# Patient Record
Sex: Female | Born: 1970 | Race: White | Hispanic: No | Marital: Married | Smoking: Never smoker
Health system: Southern US, Community
[De-identification: ages and names within clinical notes are randomized; demographics above are authoritative.]

## PROBLEM LIST (undated history)

## (undated) ENCOUNTER — Ambulatory Visit: Disposition: A | Discharge status: Other Acute Inpatient Hospital | Payer: 59

## (undated) HISTORY — PX: HAND TENDON SURGERY: SHX663

---

## 2004-06-04 ENCOUNTER — Inpatient Hospital Stay (HOSPITAL_COMMUNITY): Admission: AD | Admit: 2004-06-04 | Discharge: 2004-06-07 | Payer: Self-pay | Admitting: Gynecology

## 2013-09-01 DIAGNOSIS — M7502 Adhesive capsulitis of left shoulder: Secondary | ICD-10-CM | POA: Insufficient documentation

## 2014-06-10 ENCOUNTER — Ambulatory Visit: Payer: Self-pay | Admitting: Physician Assistant

## 2014-06-16 ENCOUNTER — Ambulatory Visit: Payer: Self-pay | Admitting: Specialist

## 2015-03-17 NOTE — Op Note (Signed)
PATIENT NAME:  Sherri Ware, [name] MR#:  [id number] DATE OF BIRTH:  1971-08-02  DATE OF PROCEDURE:  06/16/2014  PREOPERATIVE DIAGNOSIS: Complete laceration of extensor tendon, right long finger.   POSTOPERATIVE DIAGNOSIS: Complete laceration of extensor tendon, right long finger.   PROCEDURE: Repair of extensor tendon, right long finger.   SURGEON: Myra Rudehristopher  Valentin Benney, M.D.   ANESTHESIA: General.   COMPLICATIONS: None.   TOURNIQUET TIME: 28 minutes.   DESCRIPTION OF PROCEDURE: Two grams of Ancef is given intravenously prior to the procedure. General anesthesia is induced. The right upper extremity is thoroughly prepped with alcohol and ChloraPrep and draped in standard sterile fashion. Under loupe magnification, the incision is extended toward the radial hand, approximately 3/8 of an inch. The dissection is carefully carried down to the previous laceration and this extension. There seem to be complete laceration of the long finger extensor tendon. Scar tissue is removed with the rongeur. The ends are mobilized and put together.  The repair is performed with a Kessler 4 - 0 Mersilene suture and then the ends of the tendon are brought together to produce a smooth surface using a #6   Nylon. The wound is thoroughly irrigated multiple times. Skin edges are infiltrated with 0.5% plain Xylocaine. The skin is closed with a running subcuticular 4-0 nylon. A soft bulky dressing is applied. Dorsal splint is applied keeping the ring and long fingers extended with slight wrist dorsiflexion.  The tourniquet is released. The patient is returned to the recovery room in satisfactory condition having tolerated the procedure quite well.   ____________________________ Clare Gandyhristopher E. Arminta Gamm, MD ces:ts D: 06/16/2014 10:46:09 ET T: 06/16/2014 11:02:28 ET JOB#: [id number]  cc: Clare Gandyhristopher E. Aadi Bordner, MD, <Dictator> Clare GandyHRISTOPHER E Laquasha Groome MD ELECTRONICALLY SIGNED 06/22/2014 13:25

## 2016-09-22 ENCOUNTER — Encounter: Payer: Self-pay | Admitting: Emergency Medicine

## 2016-09-22 ENCOUNTER — Ambulatory Visit
Admission: EM | Admit: 2016-09-22 | Discharge: 2016-09-22 | Disposition: A | Discharge status: Home / Self Care | Payer: BLUE CROSS/BLUE SHIELD | Attending: Family Medicine | Admitting: Family Medicine

## 2016-09-22 DIAGNOSIS — J069 Acute upper respiratory infection, unspecified: Secondary | ICD-10-CM

## 2016-09-22 LAB — RAPID STREP SCREEN (MED CTR MEBANE ONLY): STREPTOCOCCUS, GROUP A SCREEN (DIRECT): NEGATIVE

## 2016-09-22 MED ORDER — BENZONATATE 100 MG PO CAPS: 100.0000 mg | ORAL_CAPSULE | Freq: Three times a day (TID) | ORAL | 0 refills | Status: DC | PRN

## 2016-09-22 MED ORDER — HYDROCOD POLST-CPM POLST ER 10-8 MG/5ML PO SUER: 5.0000 mL | Freq: Every evening | ORAL | 0 refills | Status: DC | PRN

## 2016-09-22 MED ORDER — ALBUTEROL SULFATE HFA 108 (90 BASE) MCG/ACT IN AERS: 2.0000 | INHALATION_SPRAY | RESPIRATORY_TRACT | 0 refills | Status: DC | PRN

## 2016-09-22 NOTE — ED Triage Notes (Signed)
Patient c/o cough, chest congestion, sinus pressure, HAs, and sore throat that started on Sunday.

## 2016-09-22 NOTE — ED Provider Notes (Addendum)
[facility] ____________________________________________  Time seen: Approximately 12:33 PM  I have reviewed the triage vital signs and the nursing notes.   HISTORY  Chief Complaint Facial Pain; Cough; and Sore Throat   HPI Sherri Ware is a 45 y.o. female presents for the complaints of 2.5 days of runny nose, nasal congestion and cough. Patient reports cough is worse at night. States cough is occasionally productive. Reports occasional production of blowing her nose of yellowish mucus. Reports unable to breathe well through her nose and having to mouth breathe, due to congestion. States occasional wheeze heard at night. Also reports accompanying sore throat. Reports believes she had a 101 fever this morning. Denies known sick contacts. Reports continues to eat and drink well. Patient states that she feels like she has the flu. Reports that she did have her flu immunization this year. Reports unresolved with over the counter cough and congestion medication.   Denies chest pain, shortness of breath, chest pain with deep breath, abdominal pain, dysuria, neck pain, back pain, extremity pain or extremity swelling. Denies recent sickness. Denies recent antibiotic use.  Patient's last menstrual period was 09/15/2016 (exact date).Denies chance of pregnancy.  History reviewed. No pertinent past medical history.  There are no active problems to display for this patient.   History reviewed. No pertinent surgical history.  Current Outpatient Rx  . Order #: [id number] Class: Historical Med  . Order #: [id number] Class: Historical Med  . Order #: [id number] Class: Normal  . Order #: [id number] Class: Normal  . Order #: [id number] Class: Print    No current facility-administered medications for this encounter.   Current Outpatient Prescriptions:  .  griseofulvin (GRIS-PEG) 250 MG tablet, Take 250 mg by mouth 3 (three) times daily., Disp: , Rfl:  .  omeprazole (PRILOSEC) 40 MG  capsule, Take 40 mg by mouth daily., Disp: , Rfl:  .  albuterol (PROVENTIL HFA;VENTOLIN HFA) 108 (90 Base) MCG/ACT inhaler, Inhale 2 puffs into the lungs every 4 (four) hours as needed., Disp: 1 Inhaler, Rfl: 0 .  benzonatate (TESSALON PERLES) 100 MG capsule, Take 1 capsule (100 mg total) by mouth 3 (three) times daily as needed., Disp: 15 capsule, Rfl: 0 .  chlorpheniramine-HYDROcodone (TUSSIONEX PENNKINETIC ER) 10-8 MG/5ML SUER, Take 5 mLs by mouth at bedtime as needed. do not drive or operate machinery while taking as can cause drowsiness., Disp: 75 mL, Rfl: 0  Allergies Review of patient's allergies indicates no known allergies.  Family History  Problem Relation Age of Onset  . Hypertension Mother   . Diabetes Mother   . Cancer Mother     Social History Social History  Substance Use Topics  . Smoking status: Never Smoker  . Smokeless tobacco: Never Used  . Alcohol use No    Review of Systems Constitutional: As above.  Eyes: No visual changes. ENT: As above.  Cardiovascular: Denies chest pain. Respiratory: Denies shortness of breath. Gastrointestinal: No abdominal pain.  No nausea, no vomiting.  No diarrhea.  No constipation. Genitourinary: Negative for dysuria. Musculoskeletal: Negative for back pain. Skin: Negative for rash. Neurological: Negative for headaches, focal weakness or numbness.  10-point ROS otherwise negative.  ____________________________________________   PHYSICAL EXAM:  VITAL SIGNS: ED Triage Vitals  Enc Vitals Group     BP 09/22/16 1153 114/65     Pulse Rate 09/22/16 1153 84     Resp 09/22/16 1153 16     Temp 09/22/16 1153 98.2 F (36.8 C)     Temp Source 09/22/16 1153 Oral  SpO2 09/22/16 1153 98 %     Weight 09/22/16 1152 190 lb (86.2 kg)     Height 09/22/16 1152 5\' 4"  (1.626 m)     Head Circumference --      Peak Flow --      Pain Score 09/22/16 1157 3     Pain Loc --      Pain Edu? --      Excl. in GC? --     Constitutional:  Alert and oriented. Well appearing and in no acute distress. Eyes: Conjunctivae are normal. PERRL. EOMI. Head: Atraumatic. Mild tenderness to palpation bilateral frontal and maxillary sinuses. No swelling. No erythema.  Ears: no erythema, normal TMs bilaterally.   Nose:Nasal congestion with clear rhinorrhea  Mouth/Throat: Mucous membranes are moist. Mild pharyngeal erythema. No tonsillar swelling or exudate.  Neck: No stridor.  No cervical spine tenderness to palpation. Hematological/Lymphatic/Immunilogical: No cervical lymphadenopathy. Cardiovascular: Normal rate, regular rhythm. Grossly normal heart sounds.  Good peripheral circulation. Respiratory: Normal respiratory effort.  No retractions. Lungs CTAB.No wheezes, rales or rhonchi. Good air movement. Occasional dry cough noted in room, minimal bronchospasm wheeze noted with cough. Speaks in complete sentences.  Gastrointestinal: Soft and nontender.  No CVA tenderness. Musculoskeletal: No lower or upper extremity tenderness nor edema. No cervical, thoracic or lumbar tenderness to palpation.Bilateral pedal pulses equal and easily palpated.  Neurologic:  Normal speech and language. No gross focal neurologic deficits are appreciated. No gait instability. Skin:  Skin is warm, dry and intact. No rash noted. Psychiatric: Mood and affect are normal. Speech and behavior are normal.    ___________________________________________   LABS (all labs ordered are listed, but only abnormal results are displayed)  Labs Reviewed  RAPID STREP SCREEN (NOT AT [facility])     PROCEDURES Procedures   _________________________________________   INITIAL IMPRESSION / ASSESSMENT AND PLAN / ED COURSE  Pertinent labs & imaging results that were available during my care of the patient were reviewed by me and considered in my medical decision making (see chart for details).  Very well-appearing patient. 2.5 days of symptoms. Lungs clear throughout, except minimal  bronchospasm and wheeze noted with active cough. Occasional active cough noted in room. Suspect viral upper respiratory infection. Discussed swabbing for influenza versus treat for influenza with Tamiflu, patient declined Tamiflu as well as swabbing for flu. swab for strep throat.Strep negative, will culture. Encouraged supportive care. When necessary Tussionex, Tessalon Perles and albuterol inhaler. Encouraged rest, fluids and PCP follow-up as needed.Discussed indication, risks and benefits of medications with patient.  Discussed follow up with Primary care physician this week. Discussed follow up and return parameters including no resolution or any worsening concerns. Patient verbalized understanding and agreed to plan.   ____________________________________________   FINAL CLINICAL IMPRESSION(S) / ED DIAGNOSES  Final diagnoses:  Upper respiratory tract infection, unspecified type     New Prescriptions   ALBUTEROL (PROVENTIL HFA;VENTOLIN HFA) 108 (90 BASE) MCG/ACT INHALER    Inhale 2 puffs into the lungs every 4 (four) hours as needed.   BENZONATATE (TESSALON PERLES) 100 MG CAPSULE    Take 1 capsule (100 mg total) by mouth 3 (three) times daily as needed.   CHLORPHENIRAMINE-HYDROCODONE (TUSSIONEX PENNKINETIC ER) 10-8 MG/5ML SUER    Take 5 mLs by mouth at bedtime as needed. do not drive or operate machinery while taking as can cause drowsiness.    Note: This dictation was prepared with Dragon dictation along with smaller phrase technology. Any transcriptional errors that result from this process  are unintentional.    Clinical Course      Renford Dills, NP 09/22/16 [phone]

## 2016-09-22 NOTE — Discharge Instructions (Signed)
Take medication as prescribed. Rest. Drink plenty of fluids.  ° °Follow up with your primary care physician this week as needed. Return to Urgent care for new or worsening concerns.  ° °

## 2016-09-25 LAB — CULTURE, GROUP A STREP (THRC)

## 2016-09-26 ENCOUNTER — Telehealth: Payer: Self-pay | Admitting: Emergency Medicine

## 2016-09-26 NOTE — Telephone Encounter (Signed)
Patient notified of her throat culture result.  Patient states that she is feeling much better.  Patient instructed to follow-up here or with PCP if her symptoms worsen.  Patient verbalized understanding.

## 2017-03-31 ENCOUNTER — Other Ambulatory Visit: Payer: Self-pay | Admitting: Obstetrics & Gynecology

## 2017-03-31 DIAGNOSIS — R928 Other abnormal and inconclusive findings on diagnostic imaging of breast: Secondary | ICD-10-CM

## 2017-03-31 DIAGNOSIS — Z1239 Encounter for other screening for malignant neoplasm of breast: Secondary | ICD-10-CM

## 2017-04-02 ENCOUNTER — Telehealth: Payer: Self-pay | Admitting: Obstetrics & Gynecology

## 2017-04-02 NOTE — Telephone Encounter (Signed)
Lmtrc

## 2017-04-13 NOTE — Telephone Encounter (Signed)
Lmtrc

## 2017-04-15 NOTE — Telephone Encounter (Signed)
Lmtrc

## 2017-04-15 NOTE — Telephone Encounter (Signed)
I called [facility] and spoke to Esaw DaceGail Tate (different Dondra SpryGail from the earlier call) and asked if there was a program available that may cover the cost for the patient. She will check and call back. My phone# and ext were given.

## 2017-04-15 NOTE — Telephone Encounter (Signed)
I spoke to [facility] @ [facility], who said one of their schedulers spoke to the patient on 04/02/17 @ 3:45pm, that the patient stated she shouldn't have orders from her doctor, that she hadn't been seen in a long time, that they must have her confused with someone else, but after confirming patient info, the patient said she was sorry but she didn't want to schedule an appointment.  As soon as I hung up with Dondra SpryGail, the patient returned my earlier call. She said she accidentally messed up her insurance and went to a high deductible plan this year, that everything is out of pocket and she cannot afford to be seen until January when her insurance is changed back. I suggested [facility] in December to schedule the appointment and if they need new orders faxed to let us know. She said she would. I also mentioned that Dr Tiburcio PeaHarris said she is due for her annual, and that annuals are considered preventative with insurance, but the patient said she does not want to schedule the annual, that [place] in [place] and it's hard for [place] to be seen in [place]. The patient said she would call back when she can schedule an appointment.

## 2017-04-21 NOTE — Telephone Encounter (Signed)
I contacted Esaw DaceGail Tate about whether [facility] had a program that would assist the patient with costs, and they do not. Per Dondra SpryGail, they do have a 30% discount but the patient would have to let them know at the time of the appointment not to file her insurance, and the patient would have to pay for the tests at the time of the appt. Per Dondra SpryGail $405 dx bilat mammo and [phone] uni ultrasound so patient would owe $673 - 30% at time of appt. They also take Visa and MasterCard.  I contacted Joellyn QuailsChristy Burton @ [facility], but the patient would have to be seen for their screening mammogram to have coverage for the diagnostic. Lmtrc for the patient.

## 2017-04-24 NOTE — Telephone Encounter (Signed)
I contacted the patient and gave her the self pay information and details according to my conversation with Esaw DaceGail Tate at [facility]. The patient said she does have an HSA and could use the MasterCard, and said she may call [facility] to go ahead and have the imaging done.

## 2017-05-01 ENCOUNTER — Encounter: Payer: Self-pay | Admitting: Obstetrics & Gynecology

## 2017-05-05 ENCOUNTER — Encounter: Payer: Self-pay | Admitting: Obstetrics & Gynecology

## 2017-07-06 ENCOUNTER — Encounter: Payer: Self-pay | Admitting: Obstetrics & Gynecology

## 2019-11-04 ENCOUNTER — Other Ambulatory Visit: Payer: Self-pay

## 2019-11-04 DIAGNOSIS — Z20822 Contact with and (suspected) exposure to covid-19: Secondary | ICD-10-CM

## 2019-11-06 LAB — NOVEL CORONAVIRUS, NAA: SARS-CoV-2, NAA: NOT DETECTED

## 2020-02-23 ENCOUNTER — Ambulatory Visit: Payer: BLUE CROSS/BLUE SHIELD | Attending: Internal Medicine

## 2020-02-23 DIAGNOSIS — Z23 Encounter for immunization: Secondary | ICD-10-CM

## 2020-02-23 NOTE — Progress Notes (Signed)
   Covid-19 Vaccination Clinic  Name:  STACYE NOORI    MRN: [id number] DOB: 06/04/1971  02/23/2020  Ms. Marando was observed post Covid-19 immunization for 15 minutes without incident. She was provided with Vaccine Information Sheet and instruction to access the V-Safe system.   Ms. Tayag was instructed to call 911 with any severe reactions post vaccine: Marland Kitchen Difficulty breathing  . Swelling of face and throat  . A fast heartbeat  . A bad rash all over body  . Dizziness and weakness   Immunizations Administered    Name Date Dose VIS Date Route   Pfizer COVID-19 Vaccine 02/23/2020 11:23 AM 0.3 mL 11/04/2019 Intramuscular   Manufacturer: [employer], [employer]   Lot: [id number]   NDC: [address]-3

## 2020-02-27 ENCOUNTER — Ambulatory Visit: Payer: BLUE CROSS/BLUE SHIELD

## 2020-03-21 ENCOUNTER — Ambulatory Visit: Payer: BLUE CROSS/BLUE SHIELD | Attending: Internal Medicine

## 2020-03-21 DIAGNOSIS — Z23 Encounter for immunization: Secondary | ICD-10-CM

## 2020-03-21 NOTE — Progress Notes (Signed)
   Covid-19 Vaccination Clinic  Name:  Sherri Ware    MRN: [id number] DOB: [birth year]  03/21/2020  Sherri Ware was observed post Covid-19 immunization for 15 minutes without incident. She was provided with Vaccine Information Sheet and instruction to access the V-Safe system.   Sherri Ware was instructed to call 911 with any severe reactions post vaccine: Marland Kitchen Difficulty breathing  . Swelling of face and throat  . A fast heartbeat  . A bad rash all over body  . Dizziness and weakness   Immunizations Administered    Name Date Dose VIS Date Route   Pfizer COVID-19 Vaccine 03/21/2020  3:38 PM 0.3 mL 01/18/2019 Intramuscular   Manufacturer: [employer], [employer]   Lot: [id number]   NDC: [address]-5

## 2020-06-22 ENCOUNTER — Ambulatory Visit: Payer: BLUE CROSS/BLUE SHIELD | Attending: Internal Medicine

## 2020-06-22 DIAGNOSIS — Z20822 Contact with and (suspected) exposure to covid-19: Secondary | ICD-10-CM | POA: Insufficient documentation

## 2020-06-23 LAB — SARS-COV-2, NAA 2 DAY TAT

## 2020-06-23 LAB — NOVEL CORONAVIRUS, NAA: SARS-CoV-2, NAA: NOT DETECTED

## 2021-02-05 ENCOUNTER — Other Ambulatory Visit: Payer: Self-pay

## 2021-02-05 ENCOUNTER — Ambulatory Visit
Admission: EM | Admit: 2021-02-05 | Discharge: 2021-02-05 | Disposition: A | Discharge status: Home / Self Care | Payer: 59 | Attending: Family Medicine | Admitting: Family Medicine

## 2021-02-05 ENCOUNTER — Encounter: Payer: Self-pay | Admitting: Emergency Medicine

## 2021-02-05 DIAGNOSIS — K529 Noninfective gastroenteritis and colitis, unspecified: Secondary | ICD-10-CM

## 2021-02-05 MED ORDER — PROMETHAZINE HCL 25 MG PO TABS: 25.0000 mg | ORAL_TABLET | Freq: Three times a day (TID) | ORAL | 0 refills | Status: DC | PRN

## 2021-02-05 MED ORDER — ONDANSETRON 4 MG PO TBDP: 4.0000 mg | ORAL_TABLET | Freq: Three times a day (TID) | ORAL | 0 refills | Status: DC | PRN

## 2021-02-05 MED ORDER — ONDANSETRON HCL 4 MG/2ML IJ SOLN
8.0000 mg | Freq: Once | INTRAMUSCULAR | Status: AC
  Administered 2021-02-05: 8 mg via INTRAMUSCULAR

## 2021-02-05 NOTE — Discharge Instructions (Addendum)
Frequent sips of liquids (preferably Gatorade, Powerade, or Pedialyte).  Use the medication as directed.  If you continue to have difficulty keep liquids down despite the medication, go to the Emergency Room.  This is going around the community. It will improve over the next few days. The key is to stay hydrated as best as possible to prevent dehydration.  Take care  Dr. Adriana Simas

## 2021-02-05 NOTE — ED Provider Notes (Signed)
[facility]    CSN: [id number] Arrival date & time: 02/05/21  1813      History   Chief Complaint Chief Complaint  Patient presents with  . Emesis    HPI  50 year old female presents with nausea, vomiting, diarrhea.  Started earlier this afternoon.  She states that she had a fluoride treatment at her dentist and then subsequently had an ice coffee at [facility].  She states that she subsequently developed nausea, vomiting, diarrhea.  Patient states that she is concerned that she may have fluoride poisoning due to the dental varnish that was placed.  Patient feels incredibly nauseated currently.  No pain.  No relieving factors.  No reported sick contacts.  No other reported symptoms.   Home Medications    Prior to Admission medications   Medication Sig Start Date End Date Taking? Authorizing Provider  ondansetron (ZOFRAN ODT) 4 MG disintegrating tablet Take 1 tablet (4 mg total) by mouth every 8 (eight) hours as needed for nausea or vomiting. 02/05/21  Yes Adriana Simas, Grettell Ransdell G, DO  promethazine (PHENERGAN) 25 MG tablet Take 1 tablet (25 mg total) by mouth every 8 (eight) hours as needed for refractory nausea / vomiting. 02/05/21  Yes Teshawn Moan G, DO  albuterol (PROVENTIL HFA;VENTOLIN HFA) 108 (90 Base) MCG/ACT inhaler Inhale 2 puffs into the lungs every 4 (four) hours as needed. 09/22/16 02/05/21  Renford Dills, NP  omeprazole (PRILOSEC) 40 MG capsule Take 40 mg by mouth daily.  02/05/21  [provider]    Family History Family History  Problem Relation Age of Onset  . Hypertension Mother   . Diabetes Mother   . Cancer Mother     Social History Social History   Tobacco Use  . Smoking status: Never Smoker  . Smokeless tobacco: Never Used  Vaping Use  . Vaping Use: Never used  Substance Use Topics  . Alcohol use: No  . Drug use: No     Allergies   Patient has no known allergies.   Review of Systems Review of Systems  Constitutional: Negative for  fever.  Gastrointestinal: Positive for diarrhea, nausea and vomiting.   Physical Exam Triage Vital Signs ED Triage Vitals  Enc Vitals Group     BP 02/05/21 1830 125/89     Pulse Rate 02/05/21 1830 95     Resp 02/05/21 1830 18     Temp 02/05/21 1830 98 F (36.7 C)     Temp Source 02/05/21 1830 Oral     SpO2 02/05/21 1830 97 %     Weight 02/05/21 1828 190 lb 0.6 oz (86.2 kg)     Height 02/05/21 1828 5\' 4"  (1.626 m)     Head Circumference --      Peak Flow --      Pain Score 02/05/21 1827 0     Pain Loc --      Pain Edu? --      Excl. in GC? --    Updated Vital Signs BP 125/89 (BP Location: Right Arm)   Pulse 95   Temp 98 F (36.7 C) (Oral)   Resp 18   Ht 5\' 4"  (1.626 m)   Wt 86.2 kg   LMP 09/15/2016 (Exact Date)   SpO2 97%   BMI 32.62 kg/m   Visual Acuity Right Eye Distance:   Left Eye Distance:   Bilateral Distance:    Right Eye Near:   Left Eye Near:    Bilateral Near:     Physical  Exam Vitals and nursing note reviewed.  Constitutional:      Comments: Appears fatigued.  HENT:     Head: Normocephalic and atraumatic.     Mouth/Throat:     Pharynx: Oropharynx is clear. No oropharyngeal exudate or posterior oropharyngeal erythema.  Eyes:     General:        Right eye: No discharge.        Left eye: No discharge.     Conjunctiva/sclera: Conjunctivae normal.  Cardiovascular:     Rate and Rhythm: Normal rate and regular rhythm.  Pulmonary:     Effort: Pulmonary effort is normal.     Breath sounds: Normal breath sounds. No wheezing, rhonchi or rales.  Neurological:     Mental Status: She is alert.    UC Treatments / Results  Labs (all labs ordered are listed, but only abnormal results are displayed) Labs Reviewed - No data to display  EKG   Radiology No results found.  Procedures Procedures (including critical care time)  Medications Ordered in UC Medications  ondansetron (ZOFRAN) injection 8 mg (8 mg Intramuscular Given 02/05/21 1846)     Initial Impression / Assessment and Plan / UC Course  I have reviewed the triage vital signs and the nursing notes.  Pertinent labs & imaging results that were available during my care of the patient were reviewed by me and considered in my medical decision making (see chart for details).    49 year old female presents with gastroenteritis.  This is likely viral in origin.  I do not feel that this is secondary to her fluoride dental varnish today.  We had a lengthy discussion about this.  Patient was given IM Zofran and was tolerating fluids prior to discharge.  Sending home on ODT Zofran and as needed Phenergan.  Advised to push fluids.  Supportive care.  Final Clinical Impressions(s) / UC Diagnoses   Final diagnoses:  Gastroenteritis     Discharge Instructions     Frequent sips of liquids (preferably Gatorade, Powerade, or Pedialyte).  Use the medication as directed.  If you continue to have difficulty keep liquids down despite the medication, go to the Emergency Room.  This is going around the community. It will improve over the next few days. The key is to stay hydrated as best as possible to prevent dehydration.  Take care  Dr. Adriana Simas    ED Prescriptions    Medication Sig Dispense Auth. Provider   ondansetron (ZOFRAN ODT) 4 MG disintegrating tablet Take 1 tablet (4 mg total) by mouth every 8 (eight) hours as needed for nausea or vomiting. 20 tablet Jakin Pavao G, DO   promethazine (PHENERGAN) 25 MG tablet Take 1 tablet (25 mg total) by mouth every 8 (eight) hours as needed for refractory nausea / vomiting. 30 tablet Tommie Sams, DO     PDMP not reviewed this encounter.   Tommie Sams, [place] 02/05/21 1948

## 2021-02-05 NOTE — ED Triage Notes (Signed)
Pt c/o vomiting and diarrhea.  Started this afternoon about 2:30. She states she had a fluoride treatment at the dentist today and she looked it up on google and thinks she has fluoride poisoning. She states she had a sharp abdominal pain but has only had it once while vomiting. She states the diarrhea started about an hour ago.

## 2021-04-13 ENCOUNTER — Inpatient Hospital Stay
Admission: EM | Admit: 2021-04-13 | Discharge: 2021-04-15 | DRG: 872 | Disposition: A | Discharge status: Home / Self Care | Payer: 59 | Attending: Internal Medicine | Admitting: Internal Medicine

## 2021-04-13 ENCOUNTER — Emergency Department: Payer: 59

## 2021-04-13 ENCOUNTER — Other Ambulatory Visit: Payer: Self-pay

## 2021-04-13 DIAGNOSIS — Z20822 Contact with and (suspected) exposure to covid-19: Secondary | ICD-10-CM | POA: Diagnosis present

## 2021-04-13 DIAGNOSIS — E662 Morbid (severe) obesity with alveolar hypoventilation: Secondary | ICD-10-CM | POA: Diagnosis not present

## 2021-04-13 DIAGNOSIS — R079 Chest pain, unspecified: Secondary | ICD-10-CM | POA: Diagnosis not present

## 2021-04-13 DIAGNOSIS — R42 Dizziness and giddiness: Secondary | ICD-10-CM | POA: Diagnosis not present

## 2021-04-13 DIAGNOSIS — L0390 Cellulitis, unspecified: Secondary | ICD-10-CM

## 2021-04-13 DIAGNOSIS — Z6833 Body mass index (BMI) 33.0-33.9, adult: Secondary | ICD-10-CM | POA: Diagnosis not present

## 2021-04-13 DIAGNOSIS — Z79899 Other long term (current) drug therapy: Secondary | ICD-10-CM | POA: Diagnosis not present

## 2021-04-13 DIAGNOSIS — R55 Syncope and collapse: Secondary | ICD-10-CM | POA: Diagnosis not present

## 2021-04-13 DIAGNOSIS — A419 Sepsis, unspecified organism: Principal | ICD-10-CM | POA: Diagnosis present

## 2021-04-13 DIAGNOSIS — E871 Hypo-osmolality and hyponatremia: Secondary | ICD-10-CM | POA: Diagnosis present

## 2021-04-13 DIAGNOSIS — E669 Obesity, unspecified: Secondary | ICD-10-CM | POA: Diagnosis present

## 2021-04-13 DIAGNOSIS — N61 Mastitis without abscess: Secondary | ICD-10-CM | POA: Diagnosis present

## 2021-04-13 DIAGNOSIS — D72828 Other elevated white blood cell count: Secondary | ICD-10-CM | POA: Diagnosis not present

## 2021-04-13 LAB — COMPREHENSIVE METABOLIC PANEL
ALT: 20 U/L (ref 0–44)
AST: 18 U/L (ref 15–41)
Albumin: 4 g/dL (ref 3.5–5.0)
Alkaline Phosphatase: 96 U/L (ref 38–126)
Anion gap: 11 (ref 5–15)
BUN: 12 mg/dL (ref 6–20)
CO2: 23 mmol/L (ref 22–32)
Calcium: 9 mg/dL (ref 8.9–10.3)
Chloride: 99 mmol/L (ref 98–111)
Creatinine, Ser: 0.67 mg/dL (ref 0.44–1.00)
GFR, Estimated: >60 mL/min (ref >60)
Glucose, Bld: 106 mg/dL — ABNORMAL HIGH (ref 70–99)
Potassium: 3.5 mmol/L (ref 3.5–5.1)
Sodium: 133 mmol/L — ABNORMAL LOW (ref 135–145)
Total Bilirubin: 0.7 mg/dL (ref 0.3–1.2)
Total Protein: 8.1 g/dL (ref 6.5–8.1)

## 2021-04-13 LAB — CBC
HCT: 42.6 % (ref 36.0–46.0)
Hemoglobin: 14.3 g/dL (ref 12.0–15.0)
MCH: 28.3 pg (ref 26.0–34.0)
MCHC: 33.6 g/dL (ref 30.0–36.0)
MCV: 84.2 fL (ref 80.0–100.0)
Platelets: 235 10*3/uL (ref 150–400)
RBC: 5.06 MIL/uL (ref 3.87–5.11)
RDW: 13.2 % (ref 11.5–15.5)
WBC: 20.1 10*3/uL — ABNORMAL HIGH (ref 4.0–10.5)
nRBC: 0 % (ref 0.0–0.2)

## 2021-04-13 LAB — TROPONIN I (HIGH SENSITIVITY)
Troponin I (High Sensitivity): <2 ng/L (ref <18)
Troponin I (High Sensitivity): 3 ng/L (ref <18)

## 2021-04-13 LAB — LACTIC ACID, PLASMA: Lactic Acid, Venous: 1.7 mmol/L (ref 0.5–1.9)

## 2021-04-13 LAB — RESP PANEL BY RT-PCR (FLU A&B, COVID) ARPGX2
Influenza A by PCR: NEGATIVE
Influenza B by PCR: NEGATIVE
SARS Coronavirus 2 by RT PCR: NEGATIVE

## 2021-04-13 LAB — POC URINE PREG, ED: Preg Test, Ur: NEGATIVE

## 2021-04-13 MED ORDER — KETOROLAC TROMETHAMINE 15 MG/ML IJ SOLN
15.0000 mg | Freq: Four times a day (QID) | INTRAMUSCULAR | Status: DC | PRN
  Administered 2021-04-13 – 2021-04-14 (×3): 15 mg via INTRAVENOUS
  Filled 2021-04-13 (×4): qty 1

## 2021-04-13 MED ORDER — SODIUM CHLORIDE 0.9 % IV SOLN
2.0000 g | INTRAVENOUS | Status: DC
  Administered 2021-04-13 – 2021-04-14 (×2): 2 g via INTRAVENOUS
  Filled 2021-04-13 (×3): qty 20

## 2021-04-13 MED ORDER — MAGNESIUM HYDROXIDE 400 MG/5ML PO SUSP: 30.0000 mL | Freq: Every day | ORAL | Status: DC | PRN

## 2021-04-13 MED ORDER — SODIUM CHLORIDE 0.9 % IV BOLUS
1000.0000 mL | Freq: Once | INTRAVENOUS | Status: AC
  Administered 2021-04-13: 1000 mL via INTRAVENOUS

## 2021-04-13 MED ORDER — TRAZODONE HCL 50 MG PO TABS
25.0000 mg | ORAL_TABLET | Freq: Every evening | ORAL | Status: DC | PRN
  Administered 2021-04-13 – 2021-04-14 (×2): 25 mg via ORAL
  Filled 2021-04-13 (×2): qty 1

## 2021-04-13 MED ORDER — ACETAMINOPHEN 500 MG PO TABS
1000.0000 mg | ORAL_TABLET | Freq: Once | ORAL | Status: AC
  Administered 2021-04-13: 1000 mg via ORAL
  Filled 2021-04-13: qty 2

## 2021-04-13 MED ORDER — ENOXAPARIN SODIUM 40 MG/0.4ML IJ SOSY
40.0000 mg | PREFILLED_SYRINGE | INTRAMUSCULAR | Status: DC
  Administered 2021-04-13 – 2021-04-14 (×2): 40 mg via SUBCUTANEOUS
  Filled 2021-04-13 (×2): qty 0.4

## 2021-04-13 MED ORDER — POTASSIUM CHLORIDE 20 MEQ PO PACK
40.0000 meq | PACK | Freq: Once | ORAL | Status: DC
  Filled 2021-04-13: qty 2

## 2021-04-13 MED ORDER — ONDANSETRON HCL 4 MG/2ML IJ SOLN
4.0000 mg | Freq: Once | INTRAMUSCULAR | Status: AC
  Administered 2021-04-13: 4 mg via INTRAVENOUS

## 2021-04-13 MED ORDER — ONDANSETRON HCL 4 MG PO TABS: 4.0000 mg | ORAL_TABLET | Freq: Four times a day (QID) | ORAL | Status: DC | PRN

## 2021-04-13 MED ORDER — CLINDAMYCIN PHOSPHATE 600 MG/50ML IV SOLN
600.0000 mg | Freq: Once | INTRAVENOUS | Status: AC
  Administered 2021-04-13: 600 mg via INTRAVENOUS
  Filled 2021-04-13: qty 50

## 2021-04-13 MED ORDER — ONDANSETRON HCL 4 MG/2ML IJ SOLN
4.0000 mg | Freq: Once | INTRAMUSCULAR | Status: DC | PRN
  Filled 2021-04-13 (×2): qty 2

## 2021-04-13 MED ORDER — MORPHINE SULFATE (PF) 2 MG/ML IV SOLN: 2.0000 mg | INTRAVENOUS | Status: DC | PRN

## 2021-04-13 MED ORDER — ACETAMINOPHEN 325 MG PO TABS
650.0000 mg | ORAL_TABLET | Freq: Four times a day (QID) | ORAL | Status: DC | PRN
  Administered 2021-04-14 – 2021-04-15 (×2): 650 mg via ORAL
  Filled 2021-04-13 (×2): qty 2

## 2021-04-13 MED ORDER — ACETAMINOPHEN 650 MG RE SUPP: 650.0000 mg | Freq: Four times a day (QID) | RECTAL | Status: DC | PRN

## 2021-04-13 MED ORDER — CLINDAMYCIN PHOSPHATE 600 MG/50ML IV SOLN
600.0000 mg | Freq: Once | INTRAVENOUS | Status: DC
  Filled 2021-04-13: qty 50

## 2021-04-13 MED ORDER — ONDANSETRON HCL 4 MG/2ML IJ SOLN
4.0000 mg | Freq: Four times a day (QID) | INTRAMUSCULAR | Status: DC | PRN
  Administered 2021-04-14 – 2021-04-15 (×2): 4 mg via INTRAVENOUS
  Filled 2021-04-13 (×2): qty 2

## 2021-04-13 MED ORDER — SODIUM CHLORIDE 0.9 % IV SOLN: INTRAVENOUS | Status: DC

## 2021-04-13 NOTE — H&P (Addendum)
[facility]   PATIENT NAME: Sherri Ware    MR#:  [id number]  DATE OF BIRTH:  January 30, 1971  DATE OF ADMISSION:  04/13/2021  PRIMARY CARE PHYSICIAN: Patient, No Pcp Per (Inactive)   Patient is coming from: Home  REQUESTING/REFERRING PHYSICIAN: Artis Delay, MD CHIEF COMPLAINT:   Chief Complaint  Patient presents with  . Chest Pain    HISTORY OF PRESENT ILLNESS:  Sherri Ware is a 50 y.o. Caucasian female with no significant medical problems, who presented to the emergency room with acute onset of chest pain with associated dizziness, dyspnea and nausea that started this morning.  She woke up with right breast pain with associated swelling and redness that has been progressing to chest pain throughout the day and a feeling of presyncope.  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  She had an insect bite in her stomach about 3 weeks ago.  She also reports an infection in her finger that she popped and it drained spontaneously.  No reported fever or chills.  No bleeding diathesis.  No dysuria, oliguria or hematuria or flank pain.  ED Course: Upon presentation to the ER blood pressure was 123/79 and later 122/108 with otherwise normal vital signs.  Labs revealed mild hyponatremia of 133 and potassium 3.5.  Lactic acid was 1.7 and high-sensitivity troponin I was less than 2 and later at 3.  CBC showed leukocytosis 20.1.  Urine pregnancy test was negative.  2 blood cultures were drawn.  EKG as reviewed by me :Showed normal sinus rhythm with a rate of 98 with poor R wave progression.  Imaging: Right breast ultrasound showed right mastitis without abscess.  The patient was given 600 mg IV clindamycin, 1 g p.o. Tylenol and 4 mg IV Zofran as well as 1 L bolus of IV normal saline.  She will be admitted to medical bed for further evaluation and management. PAST MEDICAL HISTORY:  History reviewed. No pertinent past  medical history.  Right hand surgery for tendon injury.  PAST SURGICAL HISTORY:   Past Surgical History:  Procedure Laterality Date  . HAND TENDON SURGERY      SOCIAL HISTORY:   Social History   Tobacco Use  . Smoking status: Never Smoker  . Smokeless tobacco: Never Used  Substance Use Topics  . Alcohol use: No    FAMILY HISTORY:   Family History  Problem Relation Age of Onset  . Hypertension Mother   . Diabetes Mother   . Cancer Mother     DRUG ALLERGIES:  No Known Allergies  REVIEW OF SYSTEMS:   ROS As per history of present illness. All pertinent systems were reviewed above. Constitutional, HEENT, cardiovascular, respiratory, GI, GU, musculoskeletal, neuro, psychiatric, endocrine, integumentary and hematologic systems were reviewed and are otherwise negative/unremarkable except for positive findings mentioned above in the HPI.   MEDICATIONS AT HOME:   Prior to Admission medications   Medication Sig Start Date End Date Taking? Authorizing Provider  ondansetron (ZOFRAN ODT) 4 MG disintegrating tablet Take 1 tablet (4 mg total) by mouth every 8 (eight) hours as needed for nausea or vomiting. 02/05/21   Tommie Sams, DO  promethazine (PHENERGAN) 25 MG tablet Take 1 tablet (25 mg total) by mouth every 8 (eight) hours as needed for refractory nausea / vomiting. 02/05/21   Tommie Sams, DO  albuterol (PROVENTIL HFA;VENTOLIN HFA) 108 (90 Base) MCG/ACT inhaler Inhale 2 puffs  into the lungs every 4 (four) hours as needed. 09/22/16 02/05/21  Renford Dills, NP  omeprazole (PRILOSEC) 40 MG capsule Take 40 mg by mouth daily.  02/05/21  [provider]      VITAL SIGNS:  Blood pressure (!) 106/93, pulse 84, temperature 98.7 F (37.1 C), temperature source Oral, resp. rate 16, height 5' 3.5" (1.613 m), weight 83.9 kg, last menstrual period 09/15/2016, SpO2 95 %.  PHYSICAL EXAMINATION:  Physical Exam  GENERAL:  50 y.o.-year-old patient lying in the bed with no acute  distress.  EYES: Pupils equal, round, reactive to light and accommodation. No scleral icterus. Extraocular muscles intact.  HEENT: Head atraumatic, normocephalic. Oropharynx and nasopharynx clear.  NECK:  Supple, no jugular venous distention. No thyroid enlargement, no tenderness.  LUNGS: Normal breath sounds bilaterally, no wheezing, rales,rhonchi or crepitation. No use of accessory muscles of respiration.  CARDIOVASCULAR: Regular rate and rhythm, S1, S2 normal. No murmurs, rubs, or gallops.  ABDOMEN: Soft, nondistended, nontender. Bowel sounds present. No organomegaly or mass.  EXTREMITIES: No pedal edema, cyanosis, or clubbing.  NEUROLOGIC: Cranial nerves II through XII are intact. Muscle strength 5/5 in all extremities. Sensation intact. Gait not checked.  PSYCHIATRIC: The patient is alert and oriented x 3.  Normal affect and good eye contact. SKIN/breast: Right breast erythema surrounding her nipple with mild induration, warmth and tenderness without fluctuations.  No palpable axillary lymphadenopathy.    LABORATORY PANEL:   CBC Recent Labs  Lab 04/13/21 1445  WBC 20.1*  HGB 14.3  HCT 42.6  PLT 235   ------------------------------------------------------------------------------------------------------------------  Chemistries  Recent Labs  Lab 04/13/21 1445  NA 133*  K 3.5  CL 99  CO2 23  GLUCOSE 106*  BUN 12  CREATININE 0.67  CALCIUM 9.0  AST 18  ALT 20  ALKPHOS 96  BILITOT 0.7   ------------------------------------------------------------------------------------------------------------------  Cardiac Enzymes No results for input(s): TROPONINI in the last 168 hours. ------------------------------------------------------------------------------------------------------------------  RADIOLOGY:  DG Chest 2 View  Result Date: 04/13/2021 CLINICAL DATA:  Chest pain EXAM: CHEST - 2 VIEW COMPARISON:  None. FINDINGS: Heart and mediastinal contours are within normal  limits. No focal opacities or effusions. No acute bony abnormality. IMPRESSION: No active cardiopulmonary disease. Electronically Signed   By: Charlett Nose M.D.   On: 04/13/2021 15:27      IMPRESSION AND PLAN:  Active Problems:   Cellulitis of right breast  1.  Right breast moderate nonpurulent cellulitis with acute mastitis. - The patient will be admitted to a medical bed. - Will continue antibiotic therapy with IV Rocephin. - Warm compresses will be utilized. - Erythematous edges will be demarcated. - No ultrasound or clinical evidence of abscess.  2.  Sepsis secondary to right breast cellulitis and mastitis, as manifested by leukocytosis and heart rate of 98. - We will follow blood cultures. - We will continue IV Rocephin.  3.  Chest pain with associated dizziness and presyncope. - The patient had 2 negative sets of cardiac enzymes. - Pain is likely atypical due to cellulitis and sepsis. - We will check orthostatics and continue hydration with IV normal saline.  DVT prophylaxis: Lovenox. Code Status: full code. Family Communication:  The plan of care was discussed in details with the patient (who requested no other family members to be notified). I answered all questions. The patient agreed to proceed with the above mentioned plan. Further management will depend upon hospital course. Disposition Plan: Back to previous home environment Consults called: none. All the records are  reviewed and case discussed with ED provider.  Status is: Inpatient  Remains inpatient appropriate because:Ongoing active pain requiring inpatient pain management, Ongoing diagnostic testing needed not appropriate for outpatient work up, Unsafe d/c plan, IV treatments appropriate due to intensity of illness or inability to take PO and Inpatient level of care appropriate due to severity of illness   Dispo: The patient is from: Home              Anticipated d/c is to: Home              Patient currently  is not medically stable to d/c.   Difficult to place patient No   TOTAL TIME TAKING CARE OF THIS PATIENT: 55 minutes.    Hannah Beat M.D on 04/13/2021 at 7:47 PM  [facility]   From 7 PM-7 AM, contact night-coverage [email or web address]  CC: Primary care physician; Patient, No Pcp Per (Inactive)

## 2021-04-13 NOTE — ED Triage Notes (Signed)
Pt states she is having chest pain, dizziness, and nausea that started this morning- pt states she woke up with right breast pain and that its swollen and red- pt states it progressed to chest pain and new she feels like she is going to pass out

## 2021-04-13 NOTE — ED Provider Notes (Signed)
[facility] Emergency Department Provider Note  ____________________________________________   Event Date/Time   First MD Initiated Contact with Patient 04/13/21 1628     (approximate)  I have reviewed the triage vital signs and the nursing notes.   HISTORY  Chief Complaint Chest Pain    HPI Sherri Ware is a 50 y.o. female otherwise healthy who comes in for right breast swelling and redness.  Patient reports that she woke up this morning with significant right sided breast redness.  Is been constant, nothing makes it better, nothing makes it worse.  She started to feel really nauseous, lightheaded, developed a little bit of chest pain and mild shortness of breath as well.  She got Zofran here and the symptoms have since gotten much better.  Patient denies having this previously.  Patient does report a bug bite to her stomach 3 weeks ago.  She also reports having an infection on her finger but she popped it and it drained spontaneously.  She also reports being diagnosed with possible lichen sclerosis in her vaginal area and was on steroids recently.       History reviewed. No pertinent past medical history.  There are no problems to display for this patient.   Past Surgical History:  Procedure Laterality Date  . HAND TENDON SURGERY      Prior to Admission medications   Medication Sig Start Date End Date Taking? Authorizing Provider  ondansetron (ZOFRAN ODT) 4 MG disintegrating tablet Take 1 tablet (4 mg total) by mouth every 8 (eight) hours as needed for nausea or vomiting. 02/05/21   Tommie Sams, DO  promethazine (PHENERGAN) 25 MG tablet Take 1 tablet (25 mg total) by mouth every 8 (eight) hours as needed for refractory nausea / vomiting. 02/05/21   Tommie Sams, DO  albuterol (PROVENTIL HFA;VENTOLIN HFA) 108 (90 Base) MCG/ACT inhaler Inhale 2 puffs into the lungs every 4 (four) hours as needed. 09/22/16 02/05/21  Renford Dills, NP   omeprazole (PRILOSEC) 40 MG capsule Take 40 mg by mouth daily.  02/05/21  [provider]    Allergies Patient has no known allergies.  Family History  Problem Relation Age of Onset  . Hypertension Mother   . Diabetes Mother   . Cancer Mother     Social History Social History   Tobacco Use  . Smoking status: Never Smoker  . Smokeless tobacco: Never Used  Vaping Use  . Vaping Use: Never used  Substance Use Topics  . Alcohol use: No  . Drug use: No      Review of Systems Constitutional: Positive chills, lightheadedness Eyes: No visual changes. ENT: No sore throat. Cardiovascular: Positive chest pain Respiratory: Positive shortness of breath Gastrointestinal: No abdominal pain.  Positive nausea, no vomiting.  No diarrhea.  No constipation. Genitourinary: Negative for dysuria. Musculoskeletal: Negative for back pain. Skin: Positive rash Neurological: Negative for headaches, focal weakness or numbness. All other ROS negative ____________________________________________   PHYSICAL EXAM:  VITAL SIGNS: ED Triage Vitals  Enc Vitals Group     BP 04/13/21 1442 123/79     Pulse Rate 04/13/21 1442 98     Resp 04/13/21 1442 18     Temp 04/13/21 1442 98.7 F (37.1 C)     Temp Source 04/13/21 1442 Oral     SpO2 04/13/21 1442 97 %     Weight 04/13/21 1440 185 lb (83.9 kg)     Height 04/13/21 1440 5' 3.5" (1.613 m)  Head Circumference --      Peak Flow --      Pain Score 04/13/21 1439 8     Pain Loc --      Pain Edu? --      Excl. in GC? --     Constitutional: Alert and oriented. Well appearing and in no acute distress. Eyes: Conjunctivae are normal. EOMI. Head: Atraumatic. Nose: No congestion/rhinnorhea. Mouth/Throat: Mucous membranes are moist.   Neck: No stridor. Trachea Midline. FROM Cardiovascular: Normal rate, regular rhythm. Grossly normal heart sounds.  Good peripheral circulation. Respiratory: Normal respiratory effort.  No retractions.  Lungs CTAB. Gastrointestinal: Soft and nontender. No distention. No abdominal bruits.  Musculoskeletal: No lower extremity tenderness nor edema.  No joint effusions. Neurologic:  Normal speech and language. No gross focal neurologic deficits are appreciated.  Skin:  Skin is warm, dry and intact.  Right breast with erythema and warmth noted to it.  No crepitus noted.  No obvious mass noted. Psychiatric: Mood and affect are normal. Speech and behavior are normal. GU: Deferred   ____________________________________________   LABS (all labs ordered are listed, but only abnormal results are displayed)  Labs Reviewed  CBC - Abnormal; Notable for the following components:      Result Value   WBC 20.1 (*)    All other components within normal limits  COMPREHENSIVE METABOLIC PANEL - Abnormal; Notable for the following components:   Sodium 133 (*)    Glucose, Bld 106 (*)    All other components within normal limits  CULTURE, BLOOD (SINGLE)  LACTIC ACID, PLASMA  POC URINE PREG, ED  TROPONIN I (HIGH SENSITIVITY)  TROPONIN I (HIGH SENSITIVITY)   ____________________________________________   ED ECG REPORT I, Concha Se, the attending physician, personally viewed and interpreted this ECG.  Normal sinus rate of 98, no ST elevation, no T wave inversions, normal intervals ____________________________________________  RADIOLOGY Vela Prose, personally viewed and evaluated these images (plain radiographs) as part of my medical decision making, as well as reviewing the written report by the radiologist.  ED MD interpretation: No mass noted  Official radiology report(s): DG Chest 2 View  Result Date: 04/13/2021 CLINICAL DATA:  Chest pain EXAM: CHEST - 2 VIEW COMPARISON:  None. FINDINGS: Heart and mediastinal contours are within normal limits. No focal opacities or effusions. No acute bony abnormality. IMPRESSION: No active cardiopulmonary disease. Electronically Signed   By: Charlett Nose M.D.   On: 04/13/2021 15:27    ____________________________________________   PROCEDURES  Procedure(s) performed (including Critical Care):  Procedures   ____________________________________________   INITIAL IMPRESSION / ASSESSMENT AND PLAN / ED COURSE  Sherri Ware was evaluated in Emergency Department on 04/13/2021 for the symptoms described in the history of present illness. She was evaluated in the context of the global COVID-19 pandemic, which necessitated consideration that the patient might be at risk for infection with the SARS-CoV-2 virus that causes COVID-19. Institutional protocols and algorithms that pertain to the evaluation of patients at risk for COVID-19 are in a state of rapid change based on information released by regulatory bodies including the CDC and federal and state organizations. These policies and algorithms were followed during the patient's care in the ED.     Patient is a 50 year old who comes in with significant right sided breast cellulitis.  Will get ultrasound to evaluate for any masses, abscess.  Seems to be less likely necrotizing fasciitis given no crepitus felt but given it is spreading rapidly and  patient's systemic symptoms will get blood cultures, lactate evaluate for sepsis, bacteremia.  We will give patient 1 L of fluid and start patient on clindamycin.  Patient meets SIRS criteria with elevated heart rate and white count.  Although patient is very well-appearing I have lower suspicion for bacteremia given patient's continued symptoms such as nausea and lightheadedness most likely from the infection will discuss possible team for admission.  On reevaluation patient does not need pressors.  Patient understands the importance after this clears up following up for repeat mammogram      ____________________________________________   FINAL CLINICAL IMPRESSION(S) / ED DIAGNOSES   Final diagnoses:  Cellulitis  Cellulitis, unspecified  cellulitis site      MEDICATIONS GIVEN DURING THIS VISIT:  Medications  ondansetron (ZOFRAN) injection 4 mg (has no administration in time range)  cefTRIAXone (ROCEPHIN) 2 g in sodium chloride 0.9 % 100 mL IVPB (0 g Intravenous Stopped 04/13/21 2232)  enoxaparin (LOVENOX) injection 40 mg (40 mg Subcutaneous Given 04/13/21 2203)  0.9 %  sodium chloride infusion ( Intravenous Infusion Verify 04/14/21 0936)  acetaminophen (TYLENOL) tablet 650 mg (has no administration in time range)    Or  acetaminophen (TYLENOL) suppository 650 mg (has no administration in time range)  traZODone (DESYREL) tablet 25 mg (25 mg Oral Given 04/13/21 2204)  magnesium hydroxide (MILK OF MAGNESIA) suspension 30 mL (has no administration in time range)  ondansetron (ZOFRAN) tablet 4 mg ( Oral See Alternative 04/14/21 0458)    Or  ondansetron (ZOFRAN) injection 4 mg (4 mg Intravenous Given 04/14/21 0458)  morphine 2 MG/ML injection 2 mg (has no administration in time range)  ketorolac (TORADOL) 15 MG/ML injection 15 mg (15 mg Intravenous Given 04/14/21 1026)  potassium chloride (KLOR-CON) packet 40 mEq ( Oral Canceled Entry 04/13/21 2204)  acetaminophen (TYLENOL) tablet 1,000 mg (1,000 mg Oral Given 04/13/21 1646)  ondansetron (ZOFRAN) injection 4 mg (4 mg Intravenous Given 04/13/21 1646)  sodium chloride 0.9 % bolus 1,000 mL (1,000 mLs Intravenous Bolus 04/13/21 1649)  clindamycin (CLEOCIN) IVPB 600 mg (0 mg Intravenous Stopped 04/13/21 1810)  sodium chloride 0.9 % bolus 1,000 mL (0 mLs Intravenous Stopped 04/13/21 2008)     ED Discharge Orders    None       Note:  This document was prepared using Dragon voice recognition software and may include unintentional dictation errors.   Concha Se, MD 04/14/21 [phone]

## 2021-04-14 DIAGNOSIS — D72828 Other elevated white blood cell count: Secondary | ICD-10-CM

## 2021-04-14 LAB — CBC
HCT: 37.3 % (ref 36.0–46.0)
Hemoglobin: 12.2 g/dL (ref 12.0–15.0)
MCH: 27.9 pg (ref 26.0–34.0)
MCHC: 32.7 g/dL (ref 30.0–36.0)
MCV: 85.2 fL (ref 80.0–100.0)
Platelets: 201 10*3/uL (ref 150–400)
RBC: 4.38 MIL/uL (ref 3.87–5.11)
RDW: 13.3 % (ref 11.5–15.5)
WBC: 15.7 10*3/uL — ABNORMAL HIGH (ref 4.0–10.5)
nRBC: 0 % (ref 0.0–0.2)

## 2021-04-14 LAB — BASIC METABOLIC PANEL
Anion gap: 8 (ref 5–15)
BUN: 13 mg/dL (ref 6–20)
CO2: 25 mmol/L (ref 22–32)
Calcium: 8.5 mg/dL — ABNORMAL LOW (ref 8.9–10.3)
Chloride: 105 mmol/L (ref 98–111)
Creatinine, Ser: 0.75 mg/dL (ref 0.44–1.00)
GFR, Estimated: >60 mL/min (ref >60)
Glucose, Bld: 107 mg/dL — ABNORMAL HIGH (ref 70–99)
Potassium: 3.9 mmol/L (ref 3.5–5.1)
Sodium: 138 mmol/L (ref 135–145)

## 2021-04-14 LAB — MRSA PCR SCREENING: MRSA by PCR: NEGATIVE

## 2021-04-14 LAB — HIV ANTIBODY (ROUTINE TESTING W REFLEX): HIV Screen 4th Generation wRfx: NONREACTIVE

## 2021-04-14 NOTE — Plan of Care (Signed)
  Problem: Activity: Goal: Risk for activity intolerance will decrease Outcome: Progressing   Problem: Nutrition: Goal: Adequate nutrition will be maintained Outcome: Progressing   Problem: Coping: Goal: Level of anxiety will decrease Outcome: Progressing   Problem: Pain Managment: Goal: General experience of comfort will improve Outcome: Progressing   Problem: Safety: Goal: Ability to remain free from injury will improve Outcome: Progressing   

## 2021-04-14 NOTE — Progress Notes (Signed)
PROGRESS NOTE    Sherri Ware  LZJ:[id number] DOB: [birth year] DOA: 04/13/2021 PCP: Patient, No Pcp Per (Inactive)   Assessment & Plan:   Active Problems:   Cellulitis of right breast   Right breast cellulitis & mastitis: continue on IV rocephin. Continue w/ warm compresses. No abscess found on [place]. Pt usually does breast imaging at [facility]  Sepsis: met criteria w/ leukocytosis, tachycardia & breast cellulitis & mastitis. Continue on IV rocephin. Resolved  Leukocytosis: secondary to above infection. Continue on IV abxs  Obesity: BMI 33.2. Would benefit from weight loss    DVT prophylaxis: lovenox  Code Status: full Family Communication: Disposition Plan: likely d/c home  Level of care: Med-Surg   Status is: Inpatient  Remains inpatient appropriate because:IV treatments appropriate due to intensity of illness or inability to take PO and Inpatient level of care appropriate due to severity of illness   Dispo: The patient is from: Home              Anticipated d/c is to: Home              Patient currently is not medically stable to d/c.   Difficult to place patient No    Consultants:      Procedures:    Antimicrobials: rocephin    Subjective: Pt c/o right breast pain  Objective: Vitals:   04/13/21 1800 04/13/21 2038 04/13/21 2142 04/13/21 2147  BP: (!) 106/93 115/60  (!) 103/54  Pulse: 84   83  Resp: 16   16  Temp:    97.7 F (36.5 C)  TempSrc:      SpO2: 95%   100%  Weight:   85.2 kg   Height:   5' 3"  (1.6 m)     Intake/Output Summary (Last 24 hours) at 04/14/2021 0730 Last data filed at 04/14/2021 0240 Gross per 24 hour  Intake 1766.4 ml  Output --  Net 1766.4 ml   Filed Weights   04/13/21 1440 04/13/21 2142  Weight: 83.9 kg 85.2 kg    Examination:  General exam: Appears calm and comfortable  Respiratory system: Clear to auscultation. Respiratory effort normal. Cardiovascular system: S1 & S2 +. No rubs, gallops or  clicks. Gastrointestinal system: Abdomen is nondistended, soft and nontender. Normal bowel sounds heard. Central nervous system: Alert and oriented. Moves all extremities  Skin: erythema, tenderness to palpation of right breast  Psychiatry: Judgement and insight appear normal. Mood & affect appropriate.     Data Reviewed: I have personally reviewed following labs and imaging studies  CBC: Recent Labs  Lab 04/13/21 1445 04/14/21 0417  WBC 20.1* 15.7*  HGB 14.3 12.2  HCT 42.6 37.3  MCV 84.2 85.2  PLT 235 973   Basic Metabolic Panel: Recent Labs  Lab 04/13/21 1445 04/14/21 0417  NA 133* 138  K 3.5 3.9  CL 99 105  CO2 23 25  GLUCOSE 106* 107*  BUN 12 13  CREATININE 0.67 0.75  CALCIUM 9.0 8.5*   GFR: Estimated Creatinine Clearance: 87 mL/min (by C-G formula based on SCr of 0.75 mg/dL). Liver Function Tests: Recent Labs  Lab 04/13/21 1445  AST 18  ALT 20  ALKPHOS 96  BILITOT 0.7  PROT 8.1  ALBUMIN 4.0   No results for input(s): LIPASE, AMYLASE in the last 168 hours. No results for input(s): AMMONIA in the last 168 hours. Coagulation Profile: No results for input(s): INR, PROTIME in the last 168 hours. Cardiac Enzymes: No results for input(s): CKTOTAL,  CKMB, CKMBINDEX, TROPONINI in the last 168 hours. BNP (last 3 results) No results for input(s): PROBNP in the last 8760 hours. HbA1C: No results for input(s): HGBA1C in the last 72 hours. CBG: No results for input(s): GLUCAP in the last 168 hours. Lipid Profile: No results for input(s): CHOL, HDL, LDLCALC, TRIG, CHOLHDL, LDLDIRECT in the last 72 hours. Thyroid Function Tests: No results for input(s): TSH, T4TOTAL, FREET4, T3FREE, THYROIDAB in the last 72 hours. Anemia Panel: No results for input(s): VITAMINB12, FOLATE, FERRITIN, TIBC, IRON, RETICCTPCT in the last 72 hours. Sepsis Labs: Recent Labs  Lab 04/13/21 1537  LATICACIDVEN 1.7    Recent Results (from the past 240 hour(s))  Blood culture (single)      Status: None (Preliminary result)   Collection Time: 04/13/21  3:37 PM   Specimen: BLOOD  Result Value Ref Range Status   Specimen Description BLOOD BLOOD LEFT HAND  Final   Special Requests   Final    BOTTLES DRAWN AEROBIC AND ANAEROBIC Blood Culture results may not be optimal due to an inadequate volume of blood received in culture bottles   Culture   Final    NO GROWTH < 24 HOURS Performed at [facility], [address], [place], [place] [address]    Report Status PENDING  Incomplete  Blood culture (single)     Status: None (Preliminary result)   Collection Time: 04/13/21  5:12 PM   Specimen: BLOOD  Result Value Ref Range Status   Specimen Description BLOOD BLOOD RIGHT HAND  Final   Special Requests   Final    BOTTLES DRAWN AEROBIC AND ANAEROBIC Blood Culture results may not be optimal due to an inadequate volume of blood received in culture bottles   Culture   Final    NO GROWTH < 24 HOURS Performed at [facility], [address], [place], [place] [address]    Report Status PENDING  Incomplete  Resp Panel by RT-PCR (Flu A&B, Covid) Nasopharyngeal Swab     Status: None   Collection Time: 04/13/21  8:55 PM   Specimen: Nasopharyngeal Swab; Nasopharyngeal(NP) swabs in vial transport medium  Result Value Ref Range Status   SARS Coronavirus 2 by RT PCR NEGATIVE NEGATIVE Final    Comment: (NOTE) SARS-CoV-2 target nucleic acids are NOT DETECTED.  The SARS-CoV-2 RNA is generally detectable in upper respiratory specimens during the acute phase of infection. The lowest concentration of SARS-CoV-2 viral copies this assay can detect is 138 copies/mL. A negative result does not preclude SARS-Cov-2 infection and should not be used as the sole basis for treatment or other patient management decisions. A negative result may occur with  improper specimen collection/handling, submission of specimen other than nasopharyngeal swab, presence of viral mutation(s) within  the areas targeted by this assay, and inadequate number of viral copies(<138 copies/mL). A negative result must be combined with clinical observations, patient history, and epidemiological information. The expected result is Negative.  Fact Sheet for Patients:  EntrepreneurPulse.com.au  Fact Sheet for Healthcare Providers:  IncredibleEmployment.be  This test is no t yet approved or cleared by the [place] FDA and  has been authorized for detection and/or diagnosis of SARS-CoV-2 by FDA under an Emergency Use Authorization (EUA). This EUA will remain  in effect (meaning this test can be used) for the duration of the COVID-19 declaration under Section 564(b)(1) of the Act, 21 U.S.C.section 360bbb-3(b)(1), unless the authorization is terminated  or revoked sooner.       Influenza A  by PCR NEGATIVE NEGATIVE Final   Influenza B by PCR NEGATIVE NEGATIVE Final    Comment: (NOTE) The Xpert Xpress SARS-CoV-2/FLU/RSV plus assay is intended as an aid in the diagnosis of influenza from Nasopharyngeal swab specimens and should not be used as a sole basis for treatment. Nasal washings and aspirates are unacceptable for Xpert Xpress SARS-CoV-2/FLU/RSV testing.  Fact Sheet for Patients: EntrepreneurPulse.com.au  Fact Sheet for Healthcare Providers: IncredibleEmployment.be  This test is not yet approved or cleared by the [place] FDA and has been authorized for detection and/or diagnosis of SARS-CoV-2 by FDA under an Emergency Use Authorization (EUA). This EUA will remain in effect (meaning this test can be used) for the duration of the COVID-19 declaration under Section 564(b)(1) of the Act, 21 U.S.C. section 360bbb-3(b)(1), unless the authorization is terminated or revoked.  Performed at [facility], [address], [place],  [address]          Radiology Studies: DG Chest 2  View  Result Date: 04/13/2021 CLINICAL DATA:  Chest pain EXAM: CHEST - 2 VIEW COMPARISON:  None. FINDINGS: Heart and mediastinal contours are within normal limits. No focal opacities or effusions. No acute bony abnormality. IMPRESSION: No active cardiopulmonary disease. Electronically Signed   By: Rolm Baptise M.D.   On: 04/13/2021 15:27   US BREAST LTD UNI RIGHT INC AXILLA  Result Date: 04/13/2021 CLINICAL DATA:  Right breast redness and tenderness x1 day. EXAM: ULTRASOUND OF THE RIGHT BREAST COMPARISON:  None. FINDINGS: On physical exam, right breast redness and tenderness. Targeted ultrasound is performed, showing normal RIGHT breast soft tissue without evidence of a cyst or soft tissue mass. Mild cutaneous thickening is seen along the region of interest (as per the ultrasound technologist). No fluid collections or abscesses are identified. IMPRESSION: Mild cutaneous thickening along the region of interest within the RIGHT breast, without evidence of an associated fluid collection, abscess or soft tissue mass. RECOMMENDATION: 1. If no abscess is demonstrated on breast ultrasound, but mastitis is suspected based on clinical features, it is recommended that patient be started on an appropriate course of antibiotics and referred to the [facility] in 3-5 days for re-evaluation. 2. If patient does not have a primary care physician and is to be referred to the [facility], patient is to be first referred the following morning to the [facility] who has agreed to aid in the outpatient management ([address]) (phone# [phone]). Recommended antibiotics for outpatient treatment of mastitis: No MRSA risk 1. Keflex 500 mg po qid 2. Clindamycin 300 mg po tid MRSA risk 1. Bactrim 2 tabs po bid 2. Doxycycline 100 mg po bid Electronically Signed   By: Virgina Norfolk M.D.   On: 04/13/2021 18:19        Scheduled Meds: .  enoxaparin (LOVENOX) injection  40 mg Subcutaneous Q24H  . potassium chloride  40 mEq Oral Once   Continuous Infusions: . sodium chloride 100 mL/hr at 04/14/21 0609  . cefTRIAXone (ROCEPHIN)  IV Stopped (04/13/21 2232)     LOS: 1 day    Time spent: 33 mins     Wyvonnia Dusky, MD [facility] Pager [phone]  If 7PM-7AM, please contact night-coverage  04/14/2021, 7:30 AM

## 2021-04-15 DIAGNOSIS — E662 Morbid (severe) obesity with alveolar hypoventilation: Secondary | ICD-10-CM

## 2021-04-15 LAB — BASIC METABOLIC PANEL
Anion gap: 6 (ref 5–15)
BUN: 8 mg/dL (ref 6–20)
CO2: 23 mmol/L (ref 22–32)
Calcium: 8.1 mg/dL — ABNORMAL LOW (ref 8.9–10.3)
Chloride: 109 mmol/L (ref 98–111)
Creatinine, Ser: 0.68 mg/dL (ref 0.44–1.00)
GFR, Estimated: >60 mL/min (ref >60)
Glucose, Bld: 107 mg/dL — ABNORMAL HIGH (ref 70–99)
Potassium: 3.7 mmol/L (ref 3.5–5.1)
Sodium: 138 mmol/L (ref 135–145)

## 2021-04-15 LAB — CBC
HCT: 34.4 % — ABNORMAL LOW (ref 36.0–46.0)
Hemoglobin: 11.4 g/dL — ABNORMAL LOW (ref 12.0–15.0)
MCH: 28.4 pg (ref 26.0–34.0)
MCHC: 33.1 g/dL (ref 30.0–36.0)
MCV: 85.6 fL (ref 80.0–100.0)
Platelets: 176 10*3/uL (ref 150–400)
RBC: 4.02 MIL/uL (ref 3.87–5.11)
RDW: 13.5 % (ref 11.5–15.5)
WBC: 14.3 10*3/uL — ABNORMAL HIGH (ref 4.0–10.5)
nRBC: 0.1 % (ref 0.0–0.2)

## 2021-04-15 MED ORDER — ONDANSETRON 4 MG PO TBDP: 4.0000 mg | ORAL_TABLET | Freq: Three times a day (TID) | ORAL | 0 refills | Status: AC | PRN

## 2021-04-15 MED ORDER — CLINDAMYCIN HCL 300 MG PO CAPS: 300.0000 mg | ORAL_CAPSULE | Freq: Three times a day (TID) | ORAL | 0 refills | Status: AC

## 2021-04-15 NOTE — Progress Notes (Signed)
Sherri Ware to be D/C'd Home per MD order.  Discussed prescriptions and follow up appointments with the patient. Prescriptions given to patient, medication list explained in detail. Pt verbalized understanding.  Allergies as of 04/15/2021   No Known Allergies     Medication List    STOP taking these medications   promethazine 25 MG tablet Commonly known as: PHENERGAN     TAKE these medications   clindamycin 300 MG capsule Commonly known as: CLEOCIN Take 1 capsule (300 mg total) by mouth 3 (three) times daily for 5 days.   clobetasol ointment 0.05 % Commonly known as: TEMOVATE Apply 1 application topically 2 (two) times daily.   nystatin ointment Commonly known as: MYCOSTATIN Apply 1 application topically 2 (two) times daily.   ondansetron 4 MG disintegrating tablet Commonly known as: Zofran ODT Take 1 tablet (4 mg total) by mouth every 8 (eight) hours as needed for up to 7 days for nausea or vomiting.       Vitals:   04/14/21 1935 04/15/21 0440  BP: 95/61 (!) 116/59  Pulse: 85 95  Resp: 20 18  Temp: 98.6 F (37 C) 99.1 F (37.3 C)  SpO2: 98% 96%    Skin clean, dry and intact without evidence of skin break down, no evidence of skin tears noted. IV catheter discontinued intact. Site without signs and symptoms of complications. Dressing and pressure applied. Pt denies pain at this time. No complaints noted.  An After Visit Summary was printed and given to the patient. Patient escorted via WC, and D/C home via private auto.  Madie Reno, RN

## 2021-04-15 NOTE — Discharge Summary (Signed)
Physician Discharge Summary  Sherri Ware NPY:[id number] DOB: 1971/07/03 DOA: 04/13/2021  PCP: Patient, No Pcp Per (Inactive)  Admit date: 04/13/2021 Discharge date: 04/15/2021  Admitted From: home Disposition:  Home   Recommendations for Outpatient Follow-up:  1. Follow up with PCP in 1-2 weeks 2. F/u [facility] for repeat breast imaging in 3-5 days   Home Health: no  Equipment/Devices:  Discharge Condition: stable  CODE STATUS: full  Diet recommendation: regular  Brief/Interim Summary: HPI was taken from Dr. Sidney Ace: Sherri Ware is a 50 y.o. Caucasian female with no significant medical problems, who presented to the emergency room with acute onset of chest pain with associated dizziness, dyspnea and nausea that started this morning.  She woke up with right breast pain with associated swelling and redness that has been progressing to chest pain throughout the day and a feeling of presyncope.  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  She had an insect bite in her stomach about 3 weeks ago.  She also reports an infection in her finger that she popped and it drained spontaneously.  No reported fever or chills.  No bleeding diathesis.  No dysuria, oliguria or hematuria or flank pain.  ED Course: Upon presentation to the ER blood pressure was 123/79 and later 122/108 with otherwise normal vital signs.  Labs revealed mild hyponatremia of 133 and potassium 3.5.  Lactic acid was 1.7 and high-sensitivity troponin I was less than 2 and later at 3.  CBC showed leukocytosis 20.1.  Urine pregnancy test was negative.  2 blood cultures were drawn.  EKG as reviewed by me :Showed normal sinus rhythm with a rate of 98 with poor R wave progression.  Imaging: Right breast ultrasound showed right mastitis without abscess.  The patient was given 600 mg IV clindamycin, 1 g p.o. Tylenol and 4 mg IV Zofran as well as 1 L bolus of IV  normal saline.  She will be admitted to medical bed for further evaluation and management.  Hospital course from Dr. Jimmye Norman 5/22-5/23/22: Pt was found to have right breast cellulitis & mastitis. Pt was initially treated w/ IV rocephin while inpatient and then d/c home w/ po clindamycin. MRSA screen was neg. Pt's erythema and pain of right breast had improved prior to d/c. Pt had an appointment w/ her OBGYN  to get repeat imaging of her breast on the day of d/c.   Discharge Diagnoses:  Active Problems:   Cellulitis of right breast  Right breast cellulitis & mastitis: continue on IV rocephin while inpatient and switched to po clindamycin at d/c to finish the course. Continue w/ warm compresses. No abscess found on [place]. Pt usually does breast imaging at [facility]  Sepsis: met criteria w/ leukocytosis, tachycardia & breast cellulitis & mastitis. Continue on IV rocephin. Resolved  Leukocytosis: secondary to above infection. Continue on abxs  Obesity: BMI 33.2. Would benefit from weight loss   Discharge Instructions  Discharge Instructions    Diet - low sodium heart healthy   Complete by: As directed    Discharge instructions   Complete by: As directed    F/u w/ [facility] for repeat breast imaging in 3-5 days. F/u w/ PCP or OBGYN within 1 week   Increase activity slowly   Complete by: As directed      Allergies as of 04/15/2021   No Known Allergies     Medication List  STOP taking these medications   promethazine 25 MG tablet Commonly known as: PHENERGAN     TAKE these medications   clindamycin 300 MG capsule Commonly known as: CLEOCIN Take 1 capsule (300 mg total) by mouth 3 (three) times daily for 5 days.   clobetasol ointment 0.05 % Commonly known as: TEMOVATE Apply 1 application topically 2 (two) times daily.   nystatin ointment Commonly known as: MYCOSTATIN Apply 1 application topically 2 (two) times daily.   ondansetron 4 MG disintegrating tablet Commonly  known as: Zofran ODT Take 1 tablet (4 mg total) by mouth every 8 (eight) hours as needed for up to 7 days for nausea or vomiting.       No Known Allergies  Consultations:     Procedures/Studies: DG Chest 2 View  Result Date: 04/13/2021 CLINICAL DATA:  Chest pain EXAM: CHEST - 2 VIEW COMPARISON:  None. FINDINGS: Heart and mediastinal contours are within normal limits. No focal opacities or effusions. No acute bony abnormality. IMPRESSION: No active cardiopulmonary disease. Electronically Signed   By: Rolm Baptise M.D.   On: 04/13/2021 15:27   US BREAST LTD UNI RIGHT INC AXILLA  Result Date: 04/13/2021 CLINICAL DATA:  Right breast redness and tenderness x1 day. EXAM: ULTRASOUND OF THE RIGHT BREAST COMPARISON:  None. FINDINGS: On physical exam, right breast redness and tenderness. Targeted ultrasound is performed, showing normal RIGHT breast soft tissue without evidence of a cyst or soft tissue mass. Mild cutaneous thickening is seen along the region of interest (as per the ultrasound technologist). No fluid collections or abscesses are identified. IMPRESSION: Mild cutaneous thickening along the region of interest within the RIGHT breast, without evidence of an associated fluid collection, abscess or soft tissue mass. RECOMMENDATION: 1. If no abscess is demonstrated on breast ultrasound, but mastitis is suspected based on clinical features, it is recommended that patient be started on an appropriate course of antibiotics and referred to the [facility] in 3-5 days for re-evaluation. 2. If patient does not have a primary care physician and is to be referred to the [facility], patient is to be first referred the following morning to the [facility] who has agreed to aid in the outpatient management ([address]) (phone# [phone]). Recommended antibiotics for outpatient treatment of mastitis: No MRSA risk 1. Keflex  500 mg po qid 2. Clindamycin 300 mg po tid MRSA risk 1. Bactrim 2 tabs po bid 2. Doxycycline 100 mg po bid Electronically Signed   By: Virgina Norfolk M.D.   On: 04/13/2021 18:19        Subjective: Pt c/o breast pain but much improved from day prior    Discharge Exam: Vitals:   04/14/21 1935 04/15/21 0440  BP: 95/61 (!) 116/59  Pulse: 85 95  Resp: 20 18  Temp: 98.6 F (37 C) 99.1 F (37.3 C)  SpO2: 98% 96%   Vitals:   04/14/21 1554 04/14/21 1614 04/14/21 1935 04/15/21 0440  BP: (!) 97/47 110/61 95/61 (!) 116/59  Pulse: 88 92 85 95  Resp: _0 Temp: 97.9 F (36.6 C) (!) 100.5 F (38.1 C) 98.6 F (37 C) 99.1 F (37.3 C)  TempSrc: Oral Oral    SpO2: 100% 93% 98% 96%  Weight:      Height:        General: Pt is alert, awake, not in acute distress Cardiovascular: S1/S2 +, no rubs, no gallops Respiratory: CTA  bilaterally, no wheezing, no rhonchi Abdominal: Soft, NT, ND, bowel sounds + Extremities: no edema, no cyanosis    The results of significant diagnostics from this hospitalization (including imaging, microbiology, ancillary and laboratory) are listed below for reference.     Microbiology: Recent Results (from the past 240 hour(s))  Blood culture (single)     Status: None (Preliminary result)   Collection Time: 04/13/21  3:37 PM   Specimen: BLOOD  Result Value Ref Range Status   Specimen Description BLOOD BLOOD LEFT HAND  Final   Special Requests   Final    BOTTLES DRAWN AEROBIC AND ANAEROBIC Blood Culture results may not be optimal due to an inadequate volume of blood received in culture bottles   Culture   Final    NO GROWTH < 24 HOURS Performed at [facility], [address], [place], [place] [address]    Report Status PENDING  Incomplete  Blood culture (single)     Status: None (Preliminary result)   Collection Time: 04/13/21  5:12 PM   Specimen: BLOOD  Result Value Ref Range Status   Specimen Description BLOOD BLOOD RIGHT HAND   Final   Special Requests   Final    BOTTLES DRAWN AEROBIC AND ANAEROBIC Blood Culture results may not be optimal due to an inadequate volume of blood received in culture bottles   Culture   Final    NO GROWTH < 24 HOURS Performed at [facility], [address] [place], [place] [address]    Report Status PENDING  Incomplete  Resp Panel by RT-PCR (Flu A&B, Covid) Nasopharyngeal Swab     Status: None   Collection Time: 04/13/21  8:55 PM   Specimen: Nasopharyngeal Swab; Nasopharyngeal(NP) swabs in vial transport medium  Result Value Ref Range Status   SARS Coronavirus 2 by RT PCR NEGATIVE NEGATIVE Final    Comment: (NOTE) SARS-CoV-2 target nucleic acids are NOT DETECTED.  The SARS-CoV-2 RNA is generally detectable in upper respiratory specimens during the acute phase of infection. The lowest concentration of SARS-CoV-2 viral copies this assay can detect is 138 copies/mL. A negative result does not preclude SARS-Cov-2 infection and should not be used as the sole basis for treatment or other patient management decisions. A negative result may occur with  improper specimen collection/handling, submission of specimen other than nasopharyngeal swab, presence of viral mutation(s) within the areas targeted by this assay, and inadequate number of viral copies(<138 copies/mL). A negative result must be combined with clinical observations, patient history, and epidemiological information. The expected result is Negative.  Fact Sheet for Patients:  EntrepreneurPulse.com.au  Fact Sheet for Healthcare Providers:  IncredibleEmployment.be  This test is no t yet approved or cleared by the [place] FDA and  has been authorized for detection and/or diagnosis of SARS-CoV-2 by FDA under an Emergency Use Authorization (EUA). This EUA will remain  in effect (meaning this test can be used) for the duration of the COVID-19 declaration under Section  564(b)(1) of the Act, 21 U.S.C.section 360bbb-3(b)(1), unless the authorization is terminated  or revoked sooner.       Influenza A by PCR NEGATIVE NEGATIVE Final   Influenza B by PCR NEGATIVE NEGATIVE Final    Comment: (NOTE) The Xpert Xpress SARS-CoV-2/FLU/RSV plus assay is intended as an aid in the diagnosis of influenza from Nasopharyngeal swab specimens and should not be used as a sole basis for treatment. Nasal washings and aspirates are unacceptable for Xpert Xpress SARS-CoV-2/FLU/RSV testing.  Fact Sheet for  Patients: EntrepreneurPulse.com.au  Fact Sheet for Healthcare Providers: IncredibleEmployment.be  This test is not yet approved or cleared by the [place] FDA and has been authorized for detection and/or diagnosis of SARS-CoV-2 by FDA under an Emergency Use Authorization (EUA). This EUA will remain in effect (meaning this test can be used) for the duration of the COVID-19 declaration under Section 564(b)(1) of the Act, 21 U.S.C. section 360bbb-3(b)(1), unless the authorization is terminated or revoked.  Performed at [facility], [address] [place], [place] [address]   MRSA PCR Screening     Status: None   Collection Time: 04/14/21 10:32 AM   Specimen: Nasopharyngeal  Result Value Ref Range Status   MRSA by PCR NEGATIVE NEGATIVE Final    Comment:        The GeneXpert MRSA Assay (FDA approved for NASAL specimens only), is one component of a comprehensive MRSA colonization surveillance program. It is not intended to diagnose MRSA infection nor to guide or monitor treatment for MRSA infections. Performed at [facility], [address] [place], [place] [address]      Labs: BNP (last 3 results) No results for input(s): BNP in the last 8760 hours. Basic Metabolic Panel: Recent Labs  Lab 04/13/21 1445 04/14/21 0417 04/15/21 0520  NA 133* 138 138  K 3.5 3.9 3.7  CL 99 105 109  CO2 _0 GLUCOSE 106* 107* 107*  BUN _1 CREATININE 0.67 0.75 0.68  CALCIUM 9.0 8.5* 8.1*   Liver Function Tests: Recent Labs  Lab 04/13/21 1445  AST 18  ALT 20  ALKPHOS 96  BILITOT 0.7  PROT 8.1  ALBUMIN 4.0   No results for input(s): LIPASE, AMYLASE in the last 168 hours. No results for input(s): AMMONIA in the last 168 hours. CBC: Recent Labs  Lab 04/13/21 1445 04/14/21 0417 04/15/21 0520  WBC 20.1* 15.7* 14.3*  HGB 14.3 12.2 11.4*  HCT 42.6 37.3 34.4*  MCV 84.2 85.2 85.6  PLT 235 201 176   Cardiac Enzymes: No results for input(s): CKTOTAL, CKMB, CKMBINDEX, TROPONINI in the last 168 hours. BNP: Invalid input(s): POCBNP CBG: No results for input(s): GLUCAP in the last 168 hours. D-Dimer No results for input(s): DDIMER in the last 72 hours. Hgb A1c No results for input(s): HGBA1C in the last 72 hours. Lipid Profile No results for input(s): CHOL, HDL, LDLCALC, TRIG, CHOLHDL, LDLDIRECT in the last 72 hours. Thyroid function studies No results for input(s): TSH, T4TOTAL, T3FREE, THYROIDAB in the last 72 hours.  Invalid input(s): FREET3 Anemia work up No results for input(s): VITAMINB12, FOLATE, FERRITIN, TIBC, IRON, RETICCTPCT in the last 72 hours. Urinalysis No results found for: COLORURINE, APPEARANCEUR, [place], [place], [place], [place], [place], [place], [place], UROBILINOGEN, NITRITE, LEUKOCYTESUR Sepsis Labs Invalid input(s): PROCALCITONIN,  WBC,  LACTICIDVEN Microbiology Recent Results (from the past 240 hour(s))  Blood culture (single)     Status: None (Preliminary result)   Collection Time: 04/13/21  3:37 PM   Specimen: BLOOD  Result Value Ref Range Status   Specimen Description BLOOD BLOOD LEFT HAND  Final   Special Requests   Final    BOTTLES DRAWN AEROBIC AND ANAEROBIC Blood Culture results may not be optimal due to an inadequate volume of blood received in culture bottles   Culture   Final    NO GROWTH < 24 HOURS Performed at  [facility], [address], [place], [place] [address]    Report Status PENDING  Incomplete  Blood culture (single)     Status: None (Preliminary result)   Collection Time: 04/13/21  5:12 PM   Specimen: BLOOD  Result Value Ref Range Status   Specimen Description BLOOD BLOOD RIGHT HAND  Final   Special Requests   Final    BOTTLES DRAWN AEROBIC AND ANAEROBIC Blood Culture results may not be optimal due to an inadequate volume of blood received in culture bottles   Culture   Final    NO GROWTH < 24 HOURS Performed at [facility], [address] [place], [place] [address]    Report Status PENDING  Incomplete  Resp Panel by RT-PCR (Flu A&B, Covid) Nasopharyngeal Swab     Status: None   Collection Time: 04/13/21  8:55 PM   Specimen: Nasopharyngeal Swab; Nasopharyngeal(NP) swabs in vial transport medium  Result Value Ref Range Status   SARS Coronavirus 2 by RT PCR NEGATIVE NEGATIVE Final    Comment: (NOTE) SARS-CoV-2 target nucleic acids are NOT DETECTED.  The SARS-CoV-2 RNA is generally detectable in upper respiratory specimens during the acute phase of infection. The lowest concentration of SARS-CoV-2 viral copies this assay can detect is 138 copies/mL. A negative result does not preclude SARS-Cov-2 infection and should not be used as the sole basis for treatment or other patient management decisions. A negative result may occur with  improper specimen collection/handling, submission of specimen other than nasopharyngeal swab, presence of viral mutation(s) within the areas targeted by this assay, and inadequate number of viral copies(<138 copies/mL). A negative result must be combined with clinical observations, patient history, and epidemiological information. The expected result is Negative.  Fact Sheet for Patients:  EntrepreneurPulse.com.au  Fact Sheet for Healthcare Providers:  IncredibleEmployment.be  This test is  no t yet approved or cleared by the [place] FDA and  has been authorized for detection and/or diagnosis of SARS-CoV-2 by FDA under an Emergency Use Authorization (EUA). This EUA will remain  in effect (meaning this test can be used) for the duration of the COVID-19 declaration under Section 564(b)(1) of the Act, 21 U.S.C.section 360bbb-3(b)(1), unless the authorization is terminated  or revoked sooner.       Influenza A by PCR NEGATIVE NEGATIVE Final   Influenza B by PCR NEGATIVE NEGATIVE Final    Comment: (NOTE) The Xpert Xpress SARS-CoV-2/FLU/RSV plus assay is intended as an aid in the diagnosis of influenza from Nasopharyngeal swab specimens and should not be used as a sole basis for treatment. Nasal washings and aspirates are unacceptable for Xpert Xpress SARS-CoV-2/FLU/RSV testing.  Fact Sheet for Patients: EntrepreneurPulse.com.au  Fact Sheet for Healthcare Providers: IncredibleEmployment.be  This test is not yet approved or cleared by the [place] FDA and has been authorized for detection and/or diagnosis of SARS-CoV-2 by FDA under an Emergency Use Authorization (EUA). This EUA will remain in effect (meaning this test can be used) for the duration of the COVID-19 declaration under Section 564(b)(1) of the Act, 21 U.S.C. section 360bbb-3(b)(1), unless the authorization is terminated or revoked.  Performed at [facility], [address] [place], [place] [address]   MRSA PCR Screening     Status: None   Collection Time: 04/14/21 10:32 AM   Specimen: Nasopharyngeal  Result Value Ref Range Status   MRSA by PCR NEGATIVE NEGATIVE Final    Comment:        The GeneXpert MRSA Assay (FDA approved for NASAL specimens only), is one component of a comprehensive MRSA colonization surveillance program. It is not  intended to diagnose MRSA infection nor to guide or monitor treatment for MRSA infections. Performed at  [facility], [address] [place], [place] [address]      Time coordinating discharge: Over 30 minutes  SIGNED:   Wyvonnia Dusky, MD  [facility] 04/15/2021, 8:36 AM Pager   If 7PM-7AM, please contact night-coverage

## 2021-04-15 NOTE — Plan of Care (Signed)
  Problem: Health Behavior/Discharge Planning: Goal: Ability to manage health-related needs will improve Outcome: Progressing   Problem: Clinical Measurements: Goal: Diagnostic test results will improve Outcome: Progressing   Problem: Activity: Goal: Risk for activity intolerance will decrease Outcome: Progressing   Problem: Coping: Goal: Level of anxiety will decrease Outcome: Progressing   Problem: Pain Managment: Goal: General experience of comfort will improve Outcome: Progressing   Problem: Skin Integrity: Goal: Risk for impaired skin integrity will decrease Outcome: Progressing

## 2021-04-19 LAB — CULTURE, BLOOD (SINGLE)
Culture: NO GROWTH
Culture: NO GROWTH

## 2021-08-21 ENCOUNTER — Other Ambulatory Visit: Payer: Self-pay

## 2021-08-21 DIAGNOSIS — E663 Overweight: Secondary | ICD-10-CM | POA: Insufficient documentation

## 2021-08-21 DIAGNOSIS — S66909A Unspecified injury of unspecified muscle, fascia and tendon at wrist and hand level, unspecified hand, initial encounter: Secondary | ICD-10-CM | POA: Insufficient documentation

## 2021-08-21 DIAGNOSIS — S61409A Unspecified open wound of unspecified hand, initial encounter: Secondary | ICD-10-CM | POA: Insufficient documentation

## 2021-08-21 DIAGNOSIS — Z1211 Encounter for screening for malignant neoplasm of colon: Secondary | ICD-10-CM

## 2021-08-21 MED ORDER — NA SULFATE-K SULFATE-MG SULF 17.5-3.13-1.6 GM/177ML PO SOLN: 1.0000 | Freq: Once | ORAL | 0 refills | Status: AC

## 2021-08-21 NOTE — Progress Notes (Signed)
Gastroenterology Pre-Procedure Review  Request Date: 09/19/21 Requesting Physician: Dr. Allegra Lai  PATIENT REVIEW QUESTIONS: The patient responded to the following health history questions as indicated:    1. Are you having any GI issues? no 2. Do you have a personal history of Polyps? no 3. Do you have a family history of Colon Cancer or Polyps? no 4. Diabetes Mellitus? no 5. Joint replacements in the past 12 months?no 6. Major health problems in the past 3 months?yes (C. Difficile diarrhea 05/24/2021) 7. Any artificial heart valves, MVP, or defibrillator?no    MEDICATIONS & ALLERGIES:    Patient reports the following regarding taking any anticoagulation/antiplatelet therapy:   Plavix, Coumadin, Eliquis, Xarelto, Lovenox, Pradaxa, Brilinta, or Effient? no Aspirin? no  Patient confirms/reports the following medications:  Current Outpatient Medications  Medication Sig Dispense Refill   clobetasol ointment (TEMOVATE) 0.05 % Apply 1 application topically 2 (two) times daily.     nystatin ointment (MYCOSTATIN) Apply 1 application topically 2 (two) times daily.     No current facility-administered medications for this visit.    Patient confirms/reports the following allergies:  No Known Allergies  No orders of the defined types were placed in this encounter.   AUTHORIZATION INFORMATION Primary Insurance: 1D#: Group #:  Secondary Insurance: 1D#: Group #:  SCHEDULE INFORMATION: Date: 09/19/21 Time: Location: [facility]

## 2021-09-02 ENCOUNTER — Other Ambulatory Visit: Payer: Self-pay

## 2021-09-02 ENCOUNTER — Encounter: Payer: Self-pay | Admitting: Gastroenterology

## 2021-09-05 ENCOUNTER — Encounter: Payer: Self-pay | Admitting: Anesthesiology

## 2021-09-12 ENCOUNTER — Telehealth: Payer: Self-pay

## 2021-09-13 NOTE — Telephone Encounter (Signed)
Patient decided to cancel for 10/27 and will wait till the 1st of the year to reschedule procedure. Patient was given several dates but they did not work with her schedule. Endo unit has been notified.

## 2021-09-13 NOTE — Telephone Encounter (Signed)
Returned patients call to reschedule procedure. LVM to call office back. 

## 2021-09-19 ENCOUNTER — Ambulatory Visit: Admit: 2021-09-19 | Payer: 59 | Admitting: Gastroenterology

## 2021-09-19 SURGERY — COLONOSCOPY WITH PROPOFOL
Anesthesia: General

## 2022-10-01 IMAGING — US US BREAST*R* LIMITED INC AXILLA
1 series · 13 of 22 positions shown · non-contrast
Comparison: None.

CLINICAL DATA: Right breast redness and tenderness x1 day.

EXAM:
ULTRASOUND OF THE RIGHT BREAST

[Series 1: us breast ltd uni right inc axilla · 13 of 22 slices shown]
[im 1/22]
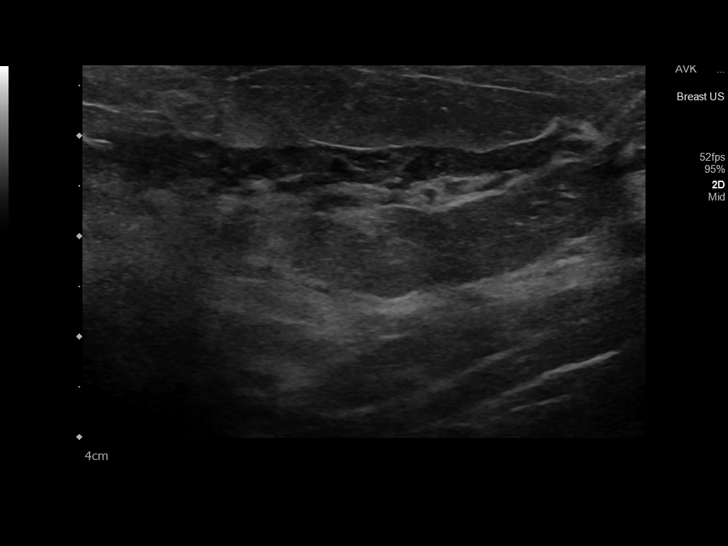
[im 3/22]
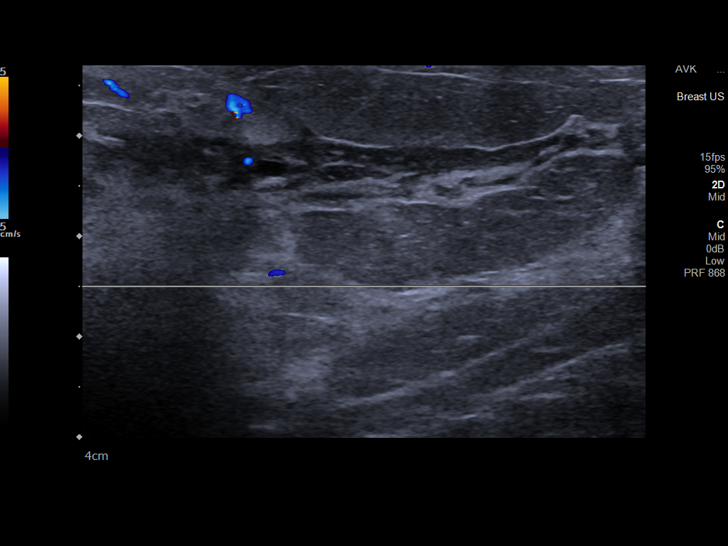
[im 5/22]
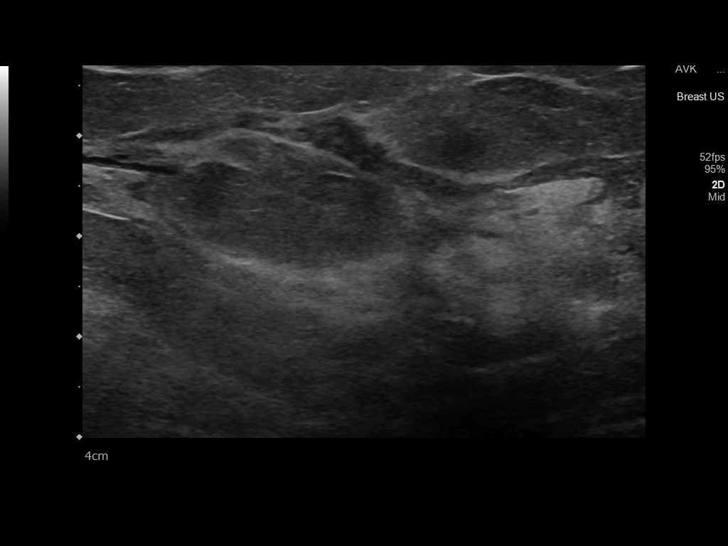
[im 6/22]
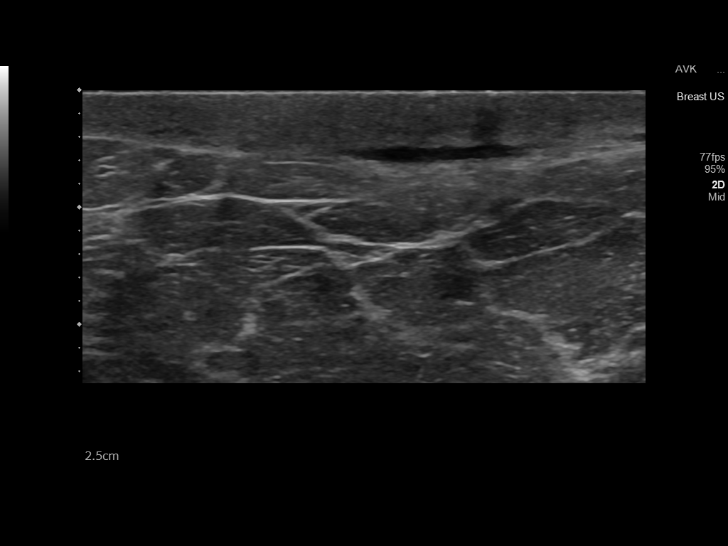
[im 8/22]
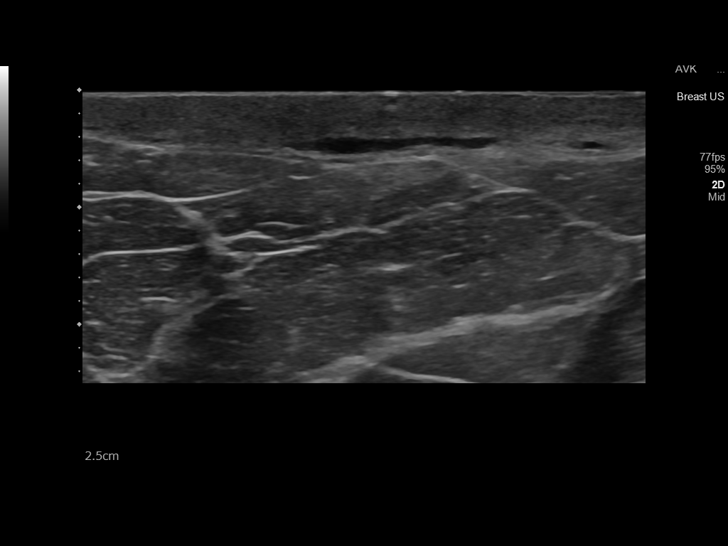
[im 10/22]
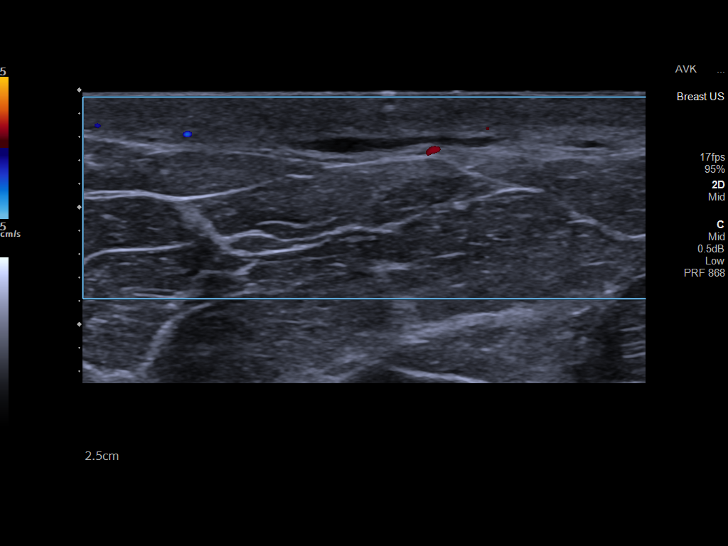
[im 12/22]
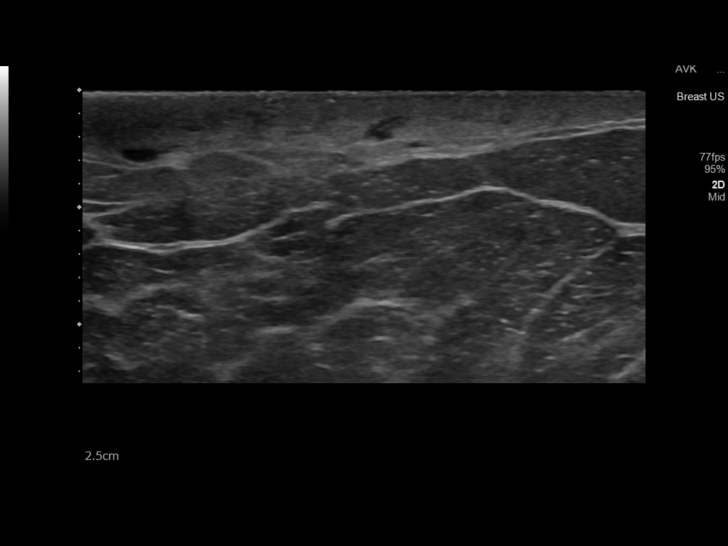
[im 13/22]
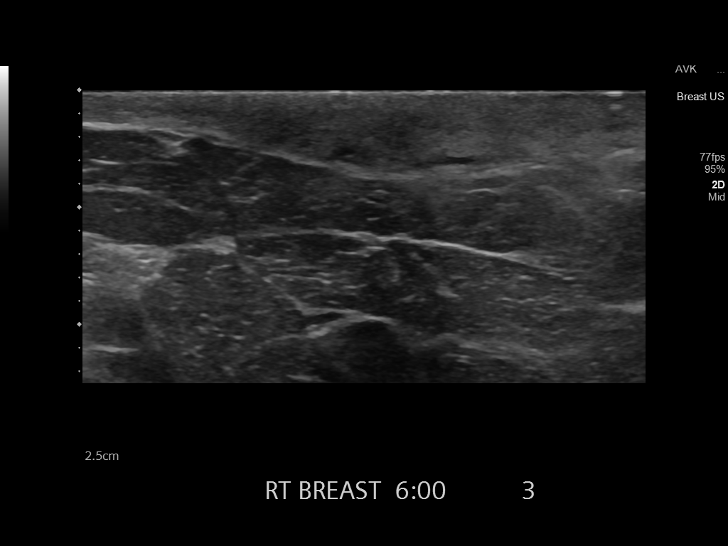
[im 15/22]
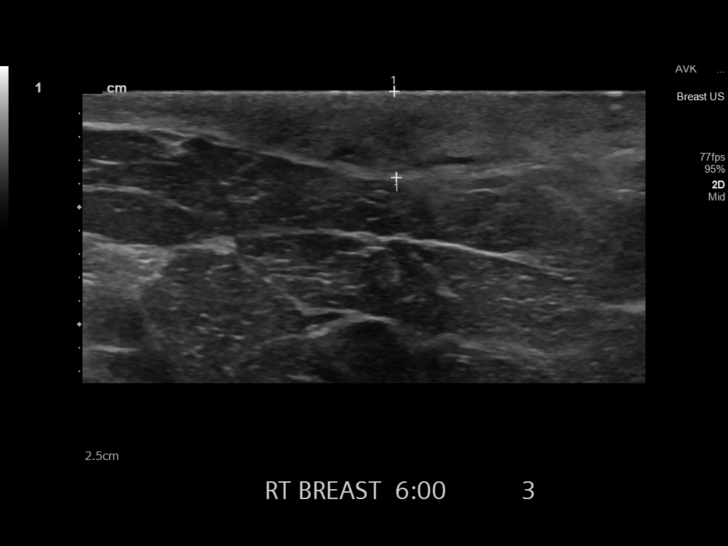
[im 17/22]
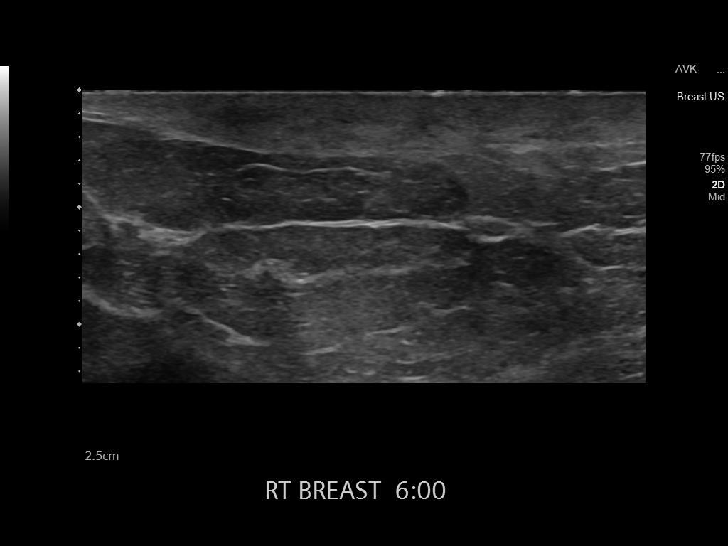
[im 18/22]
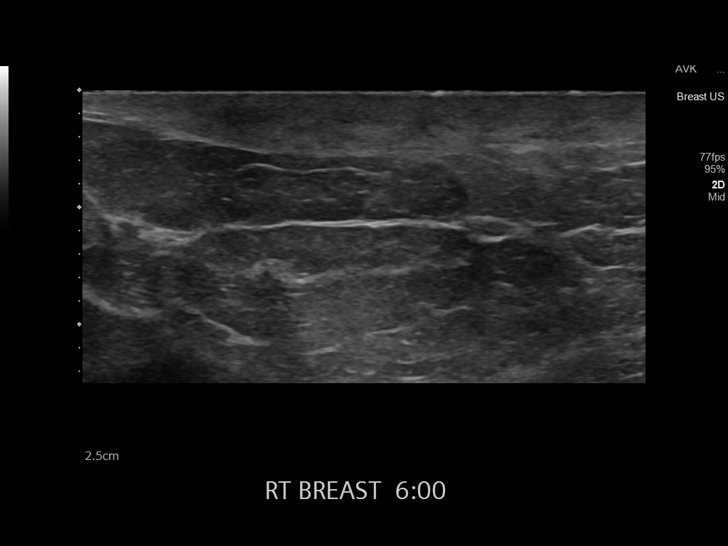
[im 20/22]
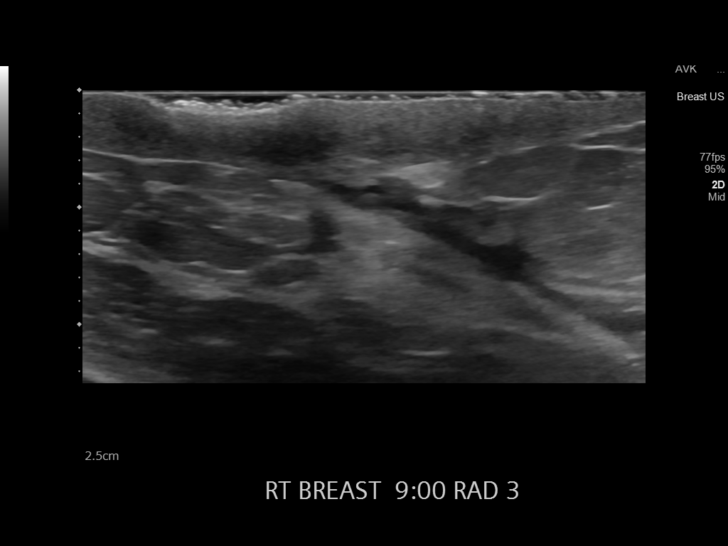
[im 22/22]
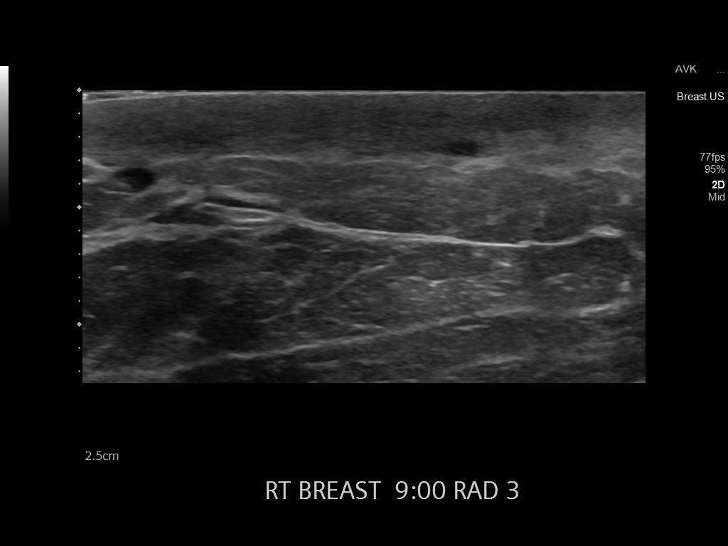

[13 of 22 positions shown; findings below may reference images not displayed]

FINDINGS: On physical exam, right breast redness and tenderness.

Targeted ultrasound is performed, showing normal RIGHT breast soft
tissue without evidence of a cyst or soft tissue mass. Mild
cutaneous thickening is seen along the region of interest (as per
the ultrasound technologist). No fluid collections or abscesses are
identified.
IMPRESSION: Mild cutaneous thickening along the region of interest within the
RIGHT breast, without evidence of an associated fluid collection,
abscess or soft tissue mass.

RECOMMENDATION:
1. If no abscess is demonstrated on breast ultrasound, but mastitis
is suspected based on clinical features, it is recommended that
patient be started on an appropriate course of antibiotics and
referred to the [REDACTED] in 3-5 days for
re-evaluation.

2. If patient does not have a primary care physician and is to be
referred to the [REDACTED], patient is to
be first referred the following morning to the [HOSPITAL] [REDACTED] who has agreed to aid in the outpatient management

Recommended antibiotics for outpatient treatment of mastitis:

No MRSA risk

1. Keflex 500 mg po qid
2. Clindamycin 300 mg po tid

MRSA risk

1. Bactrim 2 tabs po bid
2. Doxycycline 100 mg po bid

## 2024-06-10 ENCOUNTER — Encounter: Payer: Self-pay | Admitting: Advanced Practice Midwife
